# Patient Record
Sex: Female | Born: 1940 | Race: White | Hispanic: No | Marital: Married | Smoking: Former smoker
Health system: Southern US, Community
[De-identification: ages and names within clinical notes are randomized; demographics above are authoritative.]

## PROBLEM LIST (undated history)

## (undated) DIAGNOSIS — E46 Unspecified protein-calorie malnutrition: Secondary | ICD-10-CM

## (undated) DIAGNOSIS — H7290 Unspecified perforation of tympanic membrane, unspecified ear: Secondary | ICD-10-CM

## (undated) DIAGNOSIS — J479 Bronchiectasis, uncomplicated: Secondary | ICD-10-CM

## (undated) DIAGNOSIS — F419 Anxiety disorder, unspecified: Secondary | ICD-10-CM

## (undated) DIAGNOSIS — Z87442 Personal history of urinary calculi: Secondary | ICD-10-CM

## (undated) DIAGNOSIS — R319 Hematuria, unspecified: Secondary | ICD-10-CM

## (undated) DIAGNOSIS — N200 Calculus of kidney: Secondary | ICD-10-CM

## (undated) DIAGNOSIS — J189 Pneumonia, unspecified organism: Secondary | ICD-10-CM

## (undated) DIAGNOSIS — F32A Depression, unspecified: Secondary | ICD-10-CM

## (undated) DIAGNOSIS — M1990 Unspecified osteoarthritis, unspecified site: Secondary | ICD-10-CM

## (undated) HISTORY — DX: Unspecified perforation of tympanic membrane, unspecified ear: H72.90

## (undated) HISTORY — DX: Bronchiectasis, uncomplicated: J47.9

## (undated) HISTORY — PX: BREAST BIOPSY: SHX20

## (undated) HISTORY — PX: OOPHORECTOMY: SHX86

## (undated) HISTORY — DX: Calculus of kidney: N20.0

## (undated) HISTORY — PX: COLONOSCOPY: SHX174

## (undated) HISTORY — DX: Hematuria, unspecified: R31.9

---

## 1998-03-14 ENCOUNTER — Other Ambulatory Visit: Admission: RE | Admit: 1998-03-14 | Discharge: 1998-03-14 | Payer: Self-pay | Admitting: Obstetrics and Gynecology

## 1999-04-17 ENCOUNTER — Other Ambulatory Visit: Admission: RE | Admit: 1999-04-17 | Discharge: 1999-04-17 | Payer: Self-pay | Admitting: Obstetrics and Gynecology

## 2000-04-24 ENCOUNTER — Other Ambulatory Visit: Admission: RE | Admit: 2000-04-24 | Discharge: 2000-04-24 | Payer: Self-pay | Admitting: Obstetrics and Gynecology

## 2001-04-29 ENCOUNTER — Other Ambulatory Visit: Admission: RE | Admit: 2001-04-29 | Discharge: 2001-04-29 | Payer: Self-pay | Admitting: Obstetrics and Gynecology

## 2002-08-10 ENCOUNTER — Other Ambulatory Visit: Admission: RE | Admit: 2002-08-10 | Discharge: 2002-08-10 | Payer: Self-pay | Admitting: Obstetrics and Gynecology

## 2003-11-12 ENCOUNTER — Other Ambulatory Visit: Admission: RE | Admit: 2003-11-12 | Discharge: 2003-11-12 | Payer: Self-pay | Admitting: Obstetrics and Gynecology

## 2003-12-12 ENCOUNTER — Other Ambulatory Visit: Admission: RE | Admit: 2003-12-12 | Discharge: 2003-12-12 | Payer: Self-pay | Admitting: Obstetrics and Gynecology

## 2004-12-18 ENCOUNTER — Other Ambulatory Visit: Admission: RE | Admit: 2004-12-18 | Discharge: 2004-12-18 | Payer: Self-pay | Admitting: Obstetrics and Gynecology

## 2005-09-20 ENCOUNTER — Ambulatory Visit (HOSPITAL_COMMUNITY): Admission: RE | Admit: 2005-09-20 | Discharge: 2005-09-20 | Payer: Self-pay | Admitting: Family Medicine

## 2006-07-10 ENCOUNTER — Other Ambulatory Visit: Admission: RE | Admit: 2006-07-10 | Discharge: 2006-07-10 | Payer: Self-pay | Admitting: Obstetrics and Gynecology

## 2006-08-05 ENCOUNTER — Encounter (INDEPENDENT_AMBULATORY_CARE_PROVIDER_SITE_OTHER): Payer: Self-pay | Admitting: *Deleted

## 2006-08-05 ENCOUNTER — Encounter: Admission: RE | Admit: 2006-08-05 | Discharge: 2006-08-05 | Payer: Self-pay | Admitting: Surgery

## 2006-08-08 HISTORY — PX: BREAST BIOPSY: SHX20

## 2007-07-30 ENCOUNTER — Encounter: Admission: RE | Admit: 2007-07-30 | Discharge: 2007-07-30 | Payer: Self-pay | Admitting: Obstetrics and Gynecology

## 2008-08-01 ENCOUNTER — Encounter: Admission: RE | Admit: 2008-08-01 | Discharge: 2008-08-01 | Payer: Self-pay | Admitting: Obstetrics and Gynecology

## 2008-08-08 ENCOUNTER — Other Ambulatory Visit: Admission: RE | Admit: 2008-08-08 | Discharge: 2008-08-08 | Payer: Self-pay | Admitting: Obstetrics and Gynecology

## 2008-08-08 ENCOUNTER — Ambulatory Visit: Payer: Self-pay | Admitting: Obstetrics and Gynecology

## 2008-08-08 ENCOUNTER — Encounter: Payer: Self-pay | Admitting: Obstetrics and Gynecology

## 2009-02-28 ENCOUNTER — Encounter: Admission: RE | Admit: 2009-02-28 | Discharge: 2009-02-28 | Payer: Self-pay | Admitting: Family Medicine

## 2010-07-16 ENCOUNTER — Ambulatory Visit: Payer: Self-pay | Admitting: Obstetrics and Gynecology

## 2010-07-16 ENCOUNTER — Other Ambulatory Visit: Admission: RE | Admit: 2010-07-16 | Discharge: 2010-07-16 | Payer: Self-pay | Admitting: Obstetrics and Gynecology

## 2010-08-02 ENCOUNTER — Encounter: Admission: RE | Admit: 2010-08-02 | Discharge: 2010-08-02 | Payer: Self-pay | Admitting: Obstetrics and Gynecology

## 2012-05-20 ENCOUNTER — Other Ambulatory Visit: Payer: Self-pay | Admitting: Family Medicine

## 2012-05-20 ENCOUNTER — Other Ambulatory Visit: Payer: Self-pay | Admitting: Obstetrics and Gynecology

## 2012-05-20 DIAGNOSIS — Z1231 Encounter for screening mammogram for malignant neoplasm of breast: Secondary | ICD-10-CM

## 2012-05-25 ENCOUNTER — Other Ambulatory Visit: Payer: Self-pay | Admitting: Family Medicine

## 2012-05-25 DIAGNOSIS — Z780 Asymptomatic menopausal state: Secondary | ICD-10-CM

## 2012-07-01 ENCOUNTER — Ambulatory Visit: Payer: Self-pay

## 2012-07-01 ENCOUNTER — Inpatient Hospital Stay: Admission: RE | Admit: 2012-07-01 | Payer: Self-pay | Source: Ambulatory Visit

## 2012-08-04 ENCOUNTER — Ambulatory Visit
Admission: RE | Admit: 2012-08-04 | Discharge: 2012-08-04 | Disposition: A | Discharge status: Home / Self Care | Payer: BC Managed Care – PPO | Source: Ambulatory Visit | Attending: Family Medicine | Admitting: Family Medicine

## 2012-08-04 DIAGNOSIS — Z780 Asymptomatic menopausal state: Secondary | ICD-10-CM

## 2012-08-04 DIAGNOSIS — Z1231 Encounter for screening mammogram for malignant neoplasm of breast: Secondary | ICD-10-CM

## 2012-10-19 ENCOUNTER — Encounter: Payer: BC Managed Care – PPO | Admitting: Women's Health

## 2013-08-24 ENCOUNTER — Other Ambulatory Visit: Payer: Self-pay

## 2013-08-24 DIAGNOSIS — Z1231 Encounter for screening mammogram for malignant neoplasm of breast: Secondary | ICD-10-CM

## 2013-09-23 ENCOUNTER — Ambulatory Visit
Admission: RE | Admit: 2013-09-23 | Discharge: 2013-09-23 | Disposition: A | Discharge status: Home / Self Care | Payer: Medicare Other | Source: Ambulatory Visit

## 2013-09-23 DIAGNOSIS — Z1231 Encounter for screening mammogram for malignant neoplasm of breast: Secondary | ICD-10-CM

## 2014-06-17 ENCOUNTER — Encounter: Payer: Self-pay | Admitting: Internal Medicine

## 2014-06-17 ENCOUNTER — Institutional Professional Consult (permissible substitution): Payer: Medicare Other | Admitting: Internal Medicine

## 2014-06-17 ENCOUNTER — Ambulatory Visit (INDEPENDENT_AMBULATORY_CARE_PROVIDER_SITE_OTHER): Payer: Medicare Other | Admitting: Internal Medicine

## 2014-06-17 ENCOUNTER — Ambulatory Visit (INDEPENDENT_AMBULATORY_CARE_PROVIDER_SITE_OTHER)
Admission: RE | Admit: 2014-06-17 | Discharge: 2014-06-17 | Disposition: A | Discharge status: Home / Self Care | Payer: Medicare Other | Source: Ambulatory Visit | Attending: Internal Medicine | Admitting: Internal Medicine

## 2014-06-17 ENCOUNTER — Encounter (INDEPENDENT_AMBULATORY_CARE_PROVIDER_SITE_OTHER): Payer: Self-pay

## 2014-06-17 VITALS — BP 118/60 | HR 78 | Temp 98.1°F | Ht 65.0 in | Wt 129.8 lb

## 2014-06-17 DIAGNOSIS — R059 Cough, unspecified: Secondary | ICD-10-CM

## 2014-06-17 DIAGNOSIS — R05 Cough: Secondary | ICD-10-CM | POA: Insufficient documentation

## 2014-06-17 NOTE — Assessment & Plan Note (Addendum)
The most common causes of chronic cough in immunocompetent adults include the following: upper airway cough syndrome (UACS), previously referred to as postnasal drip syndrome (PNDS), which is caused by variety of rhinosinus conditions; (2) asthma; (3) GERD; (4) chronic bronchitis from cigarette smoking or other inhaled environmental irritants; (5) nonasthmatic eosinophilic bronchitis; and (6) bronchiectasis.   These conditions, singly or in combination, have accounted for up to 94% of the causes of chronic cough in prospective studies.   Other conditions have constituted no >6% of the causes in prospective studies These have included bronchogenic carcinoma, chronic interstitial pneumonia, sarcoidosis, left ventricular failure, ACEI-induced cough, and aspiration from a condition associated with pharyngeal dysfunction.    Chronic cough is often simultaneously caused by more than one condition. A single cause has been found from 38 to 82% of the time, multiple causes from 18 to 62%. Multiply caused cough has been the result of three diseases up to 42% of the time.       Based on hx and exam, this is most likely:  Classic Upper airway cough syndrome, so named because it's frequently impossible to sort out how much is  CR/sinusitis with freq throat clearing (which can be related to primary GERD)   vs  causing  secondary (" extra esophageal")  GERD from wide swings in gastric pressure that occur with throat clearing, often  promoting self use of mint and menthol lozenges that reduce the lower esophageal sphincter tone and exacerbate the problem further in a cyclical fashion.   These are the same pts (now being labeled as having "irritable larynx syndrome" by some cough centers) who not infrequently have a history of having failed to tolerate ace inhibitors,  dry powder inhalers or biphosphonates or report having atypical reflux symptoms that don't respond to standard doses of PPI , and are easily confused as  having aecopd or asthma flares by even experienced allergists/ pulmonologists.   The first step is to maximize acid suppression and eliminate sinusitis from ddx then rx with 1st gen H1 per guidelines and regroup in 4 weeks if not better.   See instructions for specific recommendations which were reviewed directly with the patient who was given a copy with highlighter outlining the key components.    Discussed with pt The standardized cough guidelines published in Chest by [employer] [place] in 2006 are still the best available and consist of a multiple step process (up to 12!) , not a single office visit,  and are intended  to address this problem logically,  with an alogrithm dependent on response to empiric treatment at  each progressive step  to determine a specific diagnosis with  minimal addtional testing needed. Therefore if adherence is an issue or can't be accurately verified,  it's very unlikely the standard evaluation and treatment will be successful here.    Furthermore, response to therapy (other than acute cough suppression, which should only be used short term with avoidance of narcotic containing cough syrups if possible), can be a gradual process for which the patient may perceive immediate benefit.  Unlike going to an eye doctor where the best perscription is almost always the first one and is immediately effective, this is almost never the case in the management of chronic cough syndromes. Therefore the patient needs to commit up front to consistently adhere to recommendations  for up to 6 weeks of therapy directed at the likely underlying problem(s) before the response can be reasonably evaluated.

## 2014-06-17 NOTE — Patient Instructions (Addendum)
Please remember to go to the x-ray department downstairs for your tests - we will call you with the results when they are available.  Please see patient coordinator before you leave today  to schedule sinus CT today if at all possible  Best cough medication is mucinex or mucinex dm   GERD (REFLUX)  is an extremely common cause of respiratory symptoms, many times with no significant heartburn at all.    It can be treated with medication, but also with lifestyle changes including avoidance of late meals, excessive alcohol, smoking cessation, and avoid fatty foods, chocolate, peppermint, colas, red wine, and acidic juices such as orange juice.  NO MINT OR MENTHOL PRODUCTS SO NO COUGH DROPS  USE SUGARLESS CANDY INSTEAD (jolley ranchers or Museum/gallery curator) NO OIL BASED VITAMINS - use powdered substitutes.   Pepcid ac 20 mg at bedtime until coughing is gone for at least at week (over the counter)  Have a good trip and call us when return if not all better

## 2014-06-17 NOTE — Progress Notes (Signed)
Subjective:    Patient ID: Chelsea Morales, female    DOB: 1941-06-26  MRN: [id number]  HPI  61 yowf never regular smoker no problem whatsoever until dx with pna 08/2013 in [facility] with chronic exposure to wood fire until spring 2015 but pattern of nocturnal cough since the pna initially a lot of mucus but got better over the summer of 2015 with less discoloration, less volume but still a persistent nightly  throat tickle/cough so referred courtesy of Dr  Merri Brunette to pulmonary clinic 06/17/2014   06/17/2014 1st [facility] office visit/ Doretta Remmert  Chief Complaint  Patient presents with  . Pulmonary Consult    Referred by Dr. Merri Brunette. 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  She states that this onyl occurs when she lies down.    Not limited by breathing from desired activities   Travels frequently and plans trip to [place] w/in a week of ov.  Not aware of a sinus or dental problem and never had obvious anterior nasal drainage. Has had 2 abx ? benefit.  No obvious other patterns in day to day or daytime variabilty or assoc  cp or chest tightness, subjective wheeze overt sinus or hb symptoms. No unusual exp hx or h/o childhood pna/ asthma or knowledge of premature birth.  Sleeping ok without nocturnal  or early am exacerbation  of respiratory  c/o's or need for noct saba. Also denies any obvious fluctuation of symptoms with weather or environmental changes or other aggravating or alleviating factors except as outlined above   Current Medications, Allergies, Complete Past Medical History, Past Surgical History, Family History, and Social History were reviewed in [employer] record.            Review of Systems  Constitutional: Negative for fever, chills and unexpected weight change.  HENT: Negative for congestion, dental problem, ear pain, nosebleeds, postnasal drip, rhinorrhea, sinus pressure, sneezing, sore  throat, trouble swallowing and voice change.   Eyes: Negative for visual disturbance.  Respiratory: Positive for cough. Negative for choking and shortness of breath.   Cardiovascular: Negative for chest pain and leg swelling.  Gastrointestinal: Negative for vomiting, abdominal pain and diarrhea.  Genitourinary: Negative for difficulty urinating.  Musculoskeletal: Negative for arthralgias.  Skin: Negative for rash.  Neurological: Negative for tremors, syncope and headaches.  Hematological: Does not bruise/bleed easily.       Objective:   Physical Exam Healthy appearing amb wf  Wt Readings from Last 3 Encounters:  06/17/14 129 lb 12.8 oz (58.877 kg)     HEENT: nl dentition, turbinates, and orophanx. Nl external ear canals without cough reflex   NECK :  without JVD/Nodes/TM/ nl carotid upstrokes bilaterally   LUNGS: no acc muscle use, clear to A and P bilaterally without cough on insp or exp maneuvers   CV:  RRR  no s3 or murmur or increase in P2, no edema   ABD:  soft and nontender with nl excursion in the supine position. No bruits or organomegaly, bowel sounds nl  MS:  warm without deformities, calf tenderness, cyanosis or clubbing  SKIN: warm and dry without lesions    NEURO:  alert, approp, no deficits    CXR  06/17/2014 :  1. Peribronchial opacity in the left upper lobe lingula. This may be a chronic finding. Acute bronchitis is possible. 2. No other evidence of an acute abnormality. 3. COPD.  Assessment & Plan:

## 2014-06-20 NOTE — Progress Notes (Signed)
Quick Note:  LMTCB ______ 

## 2014-06-22 ENCOUNTER — Telehealth: Payer: Self-pay | Admitting: Internal Medicine

## 2014-06-23 ENCOUNTER — Encounter: Payer: Self-pay | Admitting: Internal Medicine

## 2014-06-23 NOTE — Progress Notes (Signed)
Quick Note:  Called spoke with patient, advised of cxr results / recs as stated by MW. Pt verbalized her understanding and denied any questions. ______ 

## 2014-06-23 NOTE — Telephone Encounter (Signed)
9.17.15 mychart message from pt: Message     I looked at the chest x-ray results. Is there medication I should take with me to [place] or things I can do to improve the state of my lungs?   Called spoke with patient, she is doing well but is leaving for [place] on 9/21 and would like to know if there are any recommendations from MW to help her overall pulmonary health prior to or during her trip.  Pt is aware MW is out of the office this week but will still send message to check for recommendations.  Dr Sherene Sires please advise, thank you.

## 2014-06-23 NOTE — Telephone Encounter (Signed)
Spoke with patient and advised of cxr results per MW Pt also stated that she had viewed her results on mychart Nothing further needed; will sign off

## 2014-06-24 ENCOUNTER — Ambulatory Visit (INDEPENDENT_AMBULATORY_CARE_PROVIDER_SITE_OTHER)
Admission: RE | Admit: 2014-06-24 | Discharge: 2014-06-24 | Disposition: A | Discharge status: Home / Self Care | Payer: Medicare Other | Source: Ambulatory Visit | Attending: Internal Medicine | Admitting: Internal Medicine

## 2014-06-24 ENCOUNTER — Encounter: Payer: Self-pay | Admitting: Internal Medicine

## 2014-06-24 ENCOUNTER — Ambulatory Visit (INDEPENDENT_AMBULATORY_CARE_PROVIDER_SITE_OTHER): Payer: Medicare Other

## 2014-06-24 DIAGNOSIS — Z23 Encounter for immunization: Secondary | ICD-10-CM

## 2014-06-24 DIAGNOSIS — R05 Cough: Secondary | ICD-10-CM

## 2014-06-24 DIAGNOSIS — R059 Cough, unspecified: Secondary | ICD-10-CM

## 2014-06-27 NOTE — Progress Notes (Signed)
Quick Note:  LMTCB ______ 

## 2014-07-07 NOTE — Progress Notes (Signed)
Quick Note:  LMTCB ______ 

## 2014-07-11 NOTE — Progress Notes (Signed)
Quick Note:  LMTCB ______ 

## 2014-07-13 ENCOUNTER — Encounter: Payer: Self-pay | Admitting: *Deleted

## 2014-07-19 ENCOUNTER — Encounter: Payer: Self-pay | Admitting: *Deleted

## 2014-07-19 NOTE — Progress Notes (Signed)
Quick Note:    Letter mailed.  ______

## 2014-08-05 ENCOUNTER — Telehealth: Payer: Self-pay | Admitting: Internal Medicine

## 2014-08-05 NOTE — Telephone Encounter (Signed)
Spoke with the pt  She just returned home from vacation and received my missed calls  I advised that we had called to let her know that her sinus scan was neg  She verbalized understanding  Nothing further needed

## 2014-09-03 ENCOUNTER — Encounter: Payer: Self-pay | Admitting: Internal Medicine

## 2014-10-10 ENCOUNTER — Other Ambulatory Visit: Payer: Self-pay

## 2014-10-10 ENCOUNTER — Encounter: Payer: Self-pay | Admitting: Internal Medicine

## 2014-10-10 DIAGNOSIS — Z1231 Encounter for screening mammogram for malignant neoplasm of breast: Secondary | ICD-10-CM

## 2014-10-19 ENCOUNTER — Ambulatory Visit
Admission: RE | Admit: 2014-10-19 | Discharge: 2014-10-19 | Disposition: A | Discharge status: Home / Self Care | Payer: Medicare Other | Source: Ambulatory Visit

## 2014-10-19 DIAGNOSIS — Z1231 Encounter for screening mammogram for malignant neoplasm of breast: Secondary | ICD-10-CM

## 2014-10-27 ENCOUNTER — Encounter: Payer: Self-pay | Admitting: Internal Medicine

## 2014-11-01 ENCOUNTER — Ambulatory Visit: Payer: Medicare Other | Admitting: Internal Medicine

## 2014-11-07 ENCOUNTER — Ambulatory Visit: Payer: Medicare Other | Admitting: Internal Medicine

## 2014-12-19 ENCOUNTER — Ambulatory Visit: Payer: Medicare Other | Admitting: Internal Medicine

## 2014-12-20 ENCOUNTER — Ambulatory Visit: Payer: Medicare Other | Admitting: Internal Medicine

## 2015-01-27 ENCOUNTER — Other Ambulatory Visit (INDEPENDENT_AMBULATORY_CARE_PROVIDER_SITE_OTHER): Payer: Medicare Other

## 2015-01-27 ENCOUNTER — Encounter: Payer: Self-pay | Admitting: Internal Medicine

## 2015-01-27 ENCOUNTER — Ambulatory Visit (INDEPENDENT_AMBULATORY_CARE_PROVIDER_SITE_OTHER)
Admission: RE | Admit: 2015-01-27 | Discharge: 2015-01-27 | Disposition: A | Discharge status: Home / Self Care | Payer: Medicare Other | Source: Ambulatory Visit | Attending: Internal Medicine | Admitting: Internal Medicine

## 2015-01-27 ENCOUNTER — Ambulatory Visit (INDEPENDENT_AMBULATORY_CARE_PROVIDER_SITE_OTHER): Payer: Medicare Other | Admitting: Internal Medicine

## 2015-01-27 VITALS — BP 112/74 | HR 74 | Ht 65.0 in | Wt 130.8 lb

## 2015-01-27 DIAGNOSIS — R059 Cough, unspecified: Secondary | ICD-10-CM

## 2015-01-27 DIAGNOSIS — R05 Cough: Secondary | ICD-10-CM

## 2015-01-27 LAB — CBC WITH DIFFERENTIAL/PLATELET
BASOS ABS: 0 10*3/uL (ref 0.0–0.1)
Basophils Relative: 0.5 % (ref 0.0–3.0)
EOS ABS: 0.2 10*3/uL (ref 0.0–0.7)
Eosinophils Relative: 3.5 % (ref 0.0–5.0)
HEMATOCRIT: 38.3 % (ref 36.0–46.0)
Hemoglobin: 12.8 g/dL (ref 12.0–15.0)
LYMPHS ABS: 1.9 10*3/uL (ref 0.7–4.0)
Lymphocytes Relative: 28.3 % (ref 12.0–46.0)
MCHC: 33.4 g/dL (ref 30.0–36.0)
MCV: 85.5 fl (ref 78.0–100.0)
MONO ABS: 0.4 10*3/uL (ref 0.1–1.0)
Monocytes Relative: 6.5 % (ref 3.0–12.0)
NEUTROS ABS: 4.2 10*3/uL (ref 1.4–7.7)
NEUTROS PCT: 61.2 % (ref 43.0–77.0)
PLATELETS: 304 10*3/uL (ref 150.0–400.0)
RBC: 4.48 Mil/uL (ref 3.87–5.11)
RDW: 14.4 % (ref 11.5–15.5)
WBC: 6.8 10*3/uL (ref 4.0–10.5)

## 2015-01-27 MED ORDER — PREDNISONE 10 MG PO TABS: ORAL_TABLET | ORAL | Status: DC

## 2015-01-27 MED ORDER — AMOXICILLIN-POT CLAVULANATE 875-125 MG PO TABS: 1.0000 | ORAL_TABLET | Freq: Two times a day (BID) | ORAL | Status: DC

## 2015-01-27 MED ORDER — FAMOTIDINE 20 MG PO TABS: ORAL_TABLET | ORAL | Status: DC

## 2015-01-27 NOTE — Patient Instructions (Addendum)
Augmentin 875 mg take one pill twice daily  X 10 days - take at breakfast and supper with large glass of water.  It would help reduce the usual side effects (diarrhea and yeast infections) if you ate cultured yogurt at lunch.   Prednisone 10 mg take  4 each am x 2 days,   2 each am x 2 days,  1 each am x 2 days and stop  Pepcid 20 mg at bedtime  GERD (REFLUX)  is an extremely common cause of respiratory symptoms just like yours , many times with no obvious heartburn at all.    It can be treated with medication, but also with lifestyle changes including avoidance of late meals, excessive alcohol, smoking cessation, and avoid fatty foods, chocolate, peppermint, colas, red wine, and acidic juices such as orange juice.  NO MINT OR MENTHOL PRODUCTS SO NO COUGH DROPS  USE SUGARLESS CANDY INSTEAD (Jolley ranchers or Stover's or Life Savers) or even ice chips will also do - the key is to swallow to prevent all throat clearing. NO OIL BASED VITAMINS - use powdered substitutes.    Please remember to go to the lab and x-ray department downstairs for your tests - we will call you with the results when they are available.  Please schedule a follow up office visit in 4 weeks, sooner if needed

## 2015-01-27 NOTE — Progress Notes (Signed)
Subjective:    Patient ID: Chelsea Morales, female    DOB: [birth year]  MRN: [id number]    Brief patient profile:  [age] yowf never regular smoker no problem whatsoever until [place] [facility] [place] [place] with chronic exposure to wood fire until spring 2015 but pattern of nocturnal cough since the pna initially a lot of mucus but got better over the summer of 2015 with less discoloration, less volume but still a persistent nightly  throat tickle/cough so referred courtesy of Dr  Merri Brunetteandace Smith to pulmonary clinic 06/17/2014    History of Present Illness  06/17/2014 1st [facility] office visit/ [name]  Chief Complaint  Patient presents with  . Pulmonary Consult    Referred by Dr. Merri Brunetteandace Smith. 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  She states that this onyl occurs when she lies down.    Not limited by breathing from desired activities   Travels frequently and plans trip to [place] w/in a week of ov.  Not aware of a sinus or dental problem and never had obvious anterior nasal drainage. Has had 2 abx ? Benefit. rec  Please see patient coordinator before you leave today  to schedule sinus CT > neg Best cough medication is mucinex or mucinex dm  GERD diet   Pepcid ac 20 mg at bedtime until coughing is gone for at least at week (over the counter)   01/27/2015 f/u ov/[name] re: chronic cough sinc 08/2013  Chief Complaint  Patient presents with  . Follow-up    Pt states her cough is slightly better, but still having noct cough- prod with dark yellow sputum. She states her chest feels sore sometimes after coughing.    most nights about 10 min p supine  Coughs for about 30 m then fine  No obvious day to day or daytime variabilty or assoc doe or cp or chest tightness, subjective wheeze overt sinus or hb symptoms. No unusual exp hx or h/o childhood pna/ asthma or knowledge of premature birth.  Sleeping ok without nocturnal  or early am exacerbation  of  respiratory  c/o's or need for noct saba. Also denies any obvious fluctuation of symptoms with weather or environmental changes or other aggravating or alleviating factors except as outlined above   Current Medications, Allergies, Complete Past Medical History, Past Surgical History, Family History, and Social History were reviewed in [facility] electronic [facility] record.  ROS  The following are not active complaints unless bolded sore throat, dysphagia, dental problems, itching, sneezing,  nasal congestion or excess/ purulent secretions, ear ache,   fever, chills, sweats, unintended wt loss, pleuritic or exertional cp, hemoptysis,  orthopnea pnd or leg swelling, presyncope, palpitations, heartburn, abdominal pain, anorexia, nausea, vomiting, diarrhea  or change in bowel or urinary habits, change in stools or urine, dysuria,hematuria,  rash, arthralgias, visual complaints, headache, numbness weakness or ataxia or problems with walking or coordination,  change in mood/affect or memory.                          Objective:   Physical Exam   Healthy appearing amb wf somber affect   Wt Readings from Last 3 Encounters:  01/27/15 130 lb 12.8 oz (59.33 kg)  06/17/14 129 lb 12.8 oz (58.877 kg)    Vital signs reviewed    HEENT: nl dentition, turbinates, and orophanx. Nl external ear canals without cough reflex  NECK :  without JVD/Nodes/TM/ nl carotid upstrokes bilaterally   LUNGS: no acc muscle use, clear to A and P bilaterally without cough on insp or exp maneuvers   CV:  RRR  no s3 or murmur or increase in P2, no edema   ABD:  soft and nontender with nl excursion in the supine position. No bruits or organomegaly, bowel sounds nl  MS:  warm without deformities, calf tenderness, cyanosis or clubbing  SKIN: warm and dry without lesions    NEURO:  alert, approp, no deficits    CXR PA and Lateral:   01/27/2015 :     I personally reviewed images and agree with radiology  impression as follows:      Slightly increased opacity in lingula adjacent to LEFT heart border, more prominent than on previous exam, subtle developing nodule not excluded.       Assessment & Plan:

## 2015-01-28 ENCOUNTER — Encounter: Payer: Self-pay | Admitting: Internal Medicine

## 2015-01-28 NOTE — Assessment & Plan Note (Addendum)
-   onset 08/2013 p pna - Sinus CT 06/24/2014 >  Trace frontal sinus mucosal thickening, otherwise clear sinuses - allergy profile 01/27/2015 >> - cxr 01/27/15 ? Lingular density  The most common causes of chronic cough in immunocompetent adults include the following: upper airway cough syndrome (UACS), previously referred to as postnasal drip syndrome (PNDS), which is caused by variety of rhinosinus conditions; (2) asthma; (3) GERD; (4) chronic bronchitis from cigarette smoking or other inhaled environmental irritants; (5) nonasthmatic eosinophilic bronchitis; and (6) bronchiectasis.   These conditions, singly or in combination, have accounted for up to 94% of the causes of chronic cough in prospective studies.   Other conditions have constituted no >6% of the causes in prospective studies These have included bronchogenic carcinoma, chronic interstitial pneumonia, sarcoidosis, left ventricular failure, ACEI-induced cough, and aspiration from a condition associated with pharyngeal dysfunction.    Chronic cough is often simultaneously caused by more than one condition. A single cause has been found from 38 to 82% of the time, multiple causes from 18 to 62%. Multiply caused cough has been the result of three diseases up to 42% of the time.       Reviewed with pt The standardized cough guidelines published in Chest by [employer] [place] in 2006 are still the best available and consist of a multiple step process (up to 12!) , not a single office visit,  and are intended  to address this problem logically,  with an alogrithm dependent on response to empiric treatment at  each progressive step  to determine a specific diagnosis with  minimal addtional testing needed. Therefore if adherence is an issue or can't be accurately verified,  it's very unlikely the standard evaluation and treatment will be successful here.    Furthermore, response to therapy (other than acute cough suppression, which should only be used  short term with avoidance of narcotic containing cough syrups if possible), can be a gradual process for which the patient may perceive immediate benefit.  Unlike going to an eye doctor where the best perscription is almost always the first one and is immediately effective, this is almost never the case in the management of chronic cough syndromes. Therefore the patient needs to commit up front to consistently adhere to recommendations  for up to 6 weeks of therapy directed at the likely underlying problem(s) before the response can be reasonably evaluated.    rec  The onset after pna is suggestive of bronchiectasis/ Lingular syndrome supported on cxr > ct next  In meantime rec noct h2 and one course augmentin/ 6 days of prednisone empirically so see what short term benefit is.  See instructions for specific recommendations which were reviewed directly with the patient who was given a copy with highlighter outlining the key components.

## 2015-01-30 ENCOUNTER — Telehealth: Payer: Self-pay | Admitting: Internal Medicine

## 2015-01-30 DIAGNOSIS — R059 Cough, unspecified: Secondary | ICD-10-CM

## 2015-01-30 DIAGNOSIS — R05 Cough: Secondary | ICD-10-CM

## 2015-01-30 DIAGNOSIS — R9389 Abnormal findings on diagnostic imaging of other specified body structures: Secondary | ICD-10-CM

## 2015-01-30 NOTE — Telephone Encounter (Signed)
Result Notes     Notes Recorded by Christen ButterLeslie M Raskin, CMA on 01/30/2015 at 12:11 PM [facility] ------  Notes Recorded by Nyoka CowdenMichael B Wert, MD on 01/28/2015 at 6:03 AM Call pt: Reviewed cxr and There is an area on L slt irregular, no def mass but to be sure rec ct chest with contrast with sinus ct if not already done   Pt is aware of results. Orders will be placed for CTs.

## 2015-01-30 NOTE — Progress Notes (Signed)
Quick Note:  LMTCB ______ 

## 2015-01-31 ENCOUNTER — Other Ambulatory Visit (INDEPENDENT_AMBULATORY_CARE_PROVIDER_SITE_OTHER): Payer: Medicare Other

## 2015-01-31 DIAGNOSIS — R059 Cough, unspecified: Secondary | ICD-10-CM

## 2015-01-31 DIAGNOSIS — R05 Cough: Secondary | ICD-10-CM | POA: Diagnosis not present

## 2015-01-31 DIAGNOSIS — R938 Abnormal findings on diagnostic imaging of other specified body structures: Secondary | ICD-10-CM | POA: Diagnosis not present

## 2015-01-31 DIAGNOSIS — R9389 Abnormal findings on diagnostic imaging of other specified body structures: Secondary | ICD-10-CM

## 2015-01-31 LAB — BASIC METABOLIC PANEL
BUN: 20 mg/dL (ref 6–23)
CALCIUM: 10 mg/dL (ref 8.4–10.5)
CO2: 29 meq/L (ref 19–32)
Chloride: 105 mEq/L (ref 96–112)
Creatinine, Ser: 0.87 mg/dL (ref 0.40–1.20)
GFR: 67.33 mL/min (ref >60.00)
GLUCOSE: 126 mg/dL — AB (ref 70–99)
Potassium: 5.2 mEq/L — ABNORMAL HIGH (ref 3.5–5.1)
SODIUM: 141 meq/L (ref 135–145)

## 2015-02-02 ENCOUNTER — Other Ambulatory Visit: Payer: Medicare Other

## 2015-02-06 ENCOUNTER — Ambulatory Visit (INDEPENDENT_AMBULATORY_CARE_PROVIDER_SITE_OTHER)
Admission: RE | Admit: 2015-02-06 | Discharge: 2015-02-06 | Disposition: A | Discharge status: Home / Self Care | Payer: Medicare Other | Source: Ambulatory Visit | Attending: Internal Medicine | Admitting: Internal Medicine

## 2015-02-06 DIAGNOSIS — R938 Abnormal findings on diagnostic imaging of other specified body structures: Secondary | ICD-10-CM

## 2015-02-06 DIAGNOSIS — R9389 Abnormal findings on diagnostic imaging of other specified body structures: Secondary | ICD-10-CM

## 2015-02-06 MED ORDER — IOHEXOL 300 MG/ML  SOLN
80.0000 mL | Freq: Once | INTRAMUSCULAR | Status: AC | PRN
  Administered 2015-02-06: 80 mL via INTRAVENOUS

## 2015-02-06 NOTE — Progress Notes (Signed)
Quick Note:  Spoke with pt and notified of results per Dr. Wert. Pt verbalized understanding and denied any questions.  ______ 

## 2015-03-07 ENCOUNTER — Encounter: Payer: Self-pay | Admitting: Internal Medicine

## 2015-03-07 ENCOUNTER — Ambulatory Visit (INDEPENDENT_AMBULATORY_CARE_PROVIDER_SITE_OTHER): Payer: Medicare Other | Admitting: Internal Medicine

## 2015-03-07 VITALS — BP 122/58 | HR 80 | Ht 65.0 in | Wt 127.0 lb

## 2015-03-07 DIAGNOSIS — R059 Cough, unspecified: Secondary | ICD-10-CM

## 2015-03-07 DIAGNOSIS — R05 Cough: Secondary | ICD-10-CM

## 2015-03-07 DIAGNOSIS — J479 Bronchiectasis, uncomplicated: Secondary | ICD-10-CM

## 2015-03-07 NOTE — Progress Notes (Signed)
Subjective:    Patient ID: Chelsea Morales, female    DOB: Dec 09, 1940  MRN: [id number]    Brief patient profile:  89 yowf never regular smoker no problem whatsoever until [place] [facility] [place] with chronic exposure to wood fire until spring 2015 but pattern of nocturnal cough since the pna initially a lot of mucus but got better over the summer of 2015 with less discoloration, less volume but still a persistent nightly  throat tickle/cough so referred courtesy of Dr  Merri Brunetteandace Smith to pulmonary clinic 06/17/2014 with confirmed bronchiectasis on CT chest 02/06/15.   History of Present Illness  06/17/2014 1st [facility] office visit/ Chelsea Morales  Chief Complaint  Patient presents with  . Pulmonary Consult    Referred by Dr. Merri Brunetteandace Smith. 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  She states that this onyl occurs when she lies down.    Not limited by breathing from desired activities   Travels frequently and [place] trip to [place]/in a week of ov.  Not aware of a sinus or dental problem and never had obvious anterior nasal drainage. Has had 2 abx ? Benefit. rec  Please see patient coordinator before you leave today  to schedule sinus CT > neg Best cough medication is mucinex or mucinex dm  GERD diet   Pepcid ac 20 mg at bedtime until coughing is gone for at least at week (over the counter)   01/27/2015 f/u ov/Chelsea Morales re: chronic cough sinc 08/2013  Chief Complaint  Patient presents with  . Follow-up    Pt states her cough is slightly better, but still having noct cough- prod with dark yellow sputum. She states her chest feels sore sometimes after coughing.   most nights about 10 min p supine  Coughs for about 30 m then fine rec Augmentin 875 mg take one pill twice daily  X 10 days - take at breakfast and supper with large glass of water.  It would help reduce the usual side effects (diarrhea and yeast infections) if you ate cultured yogurt at  lunch.  Prednisone 10 mg take  4 each am x 2 days,   2 each am x 2 days,  1 each am x 2 days and stop Pepcid 20 mg at bedtime GERD  Diet   03/07/2015 f/u ov/Chelsea Morales re: Bronchiectasis  Chief Complaint  Patient presents with  . Follow-up    Cough has resolved. No new co's today.   on no meds at all / Not limited by breathing from desired activities    No obvious day to day or daytime variabilty or assoc cough or  cp or chest tightness, subjective wheeze overt sinus or hb symptoms. No unusual exp hx or h/o childhood pna/ asthma or knowledge of premature birth.  Sleeping ok without nocturnal  or early am exacerbation  of respiratory  c/o's or need for noct saba. Also denies any obvious fluctuation of symptoms with weather or environmental changes or other aggravating or alleviating factors except as outlined above   Current Medications, Allergies, Complete Past Medical History, Past Surgical History, Family History, and Social History were reviewed in [facility] electronic medical record.  ROS  The following are not active complaints unless bolded sore throat, dysphagia, dental problems, itching, sneezing,  nasal congestion or excess/ purulent secretions, ear ache,   fever, chills, sweats, unintended wt loss, pleuritic or exertional cp, hemoptysis,  orthopnea pnd or leg swelling,  presyncope, palpitations, heartburn, abdominal pain, anorexia, nausea, vomiting, diarrhea  or change in bowel or urinary habits, change in stools or urine, dysuria,hematuria,  rash, arthralgias, visual complaints, headache, numbness weakness or ataxia or problems with walking or coordination,  change in mood/affect or memory.                          Objective:   Physical Exam   Healthy appearing amb wf somber affect   Wt Readings from Last 3 Encounters:  03/07/15 127 lb (57.607 kg)  01/27/15 130 lb 12.8 oz (59.33 kg)  06/17/14 129 lb 12.8 oz (58.877 kg)    Vital signs reviewed        HEENT: nl  dentition, turbinates, and orophanx. Nl external ear canals without cough reflex   NECK :  without JVD/Nodes/TM/ nl carotid upstrokes bilaterally   LUNGS: no acc muscle use, clear to A and P bilaterally without cough on insp or exp maneuvers   CV:  RRR  no s3 or murmur or increase in P2, no edema   ABD:  soft and nontender with nl excursion in the supine position. No bruits or organomegaly, bowel sounds nl  MS:  warm without deformities, calf tenderness, cyanosis or clubbing  SKIN: warm and dry without lesions    NEURO:  alert, approp, no deficits     I personally reviewed images and agree with radiology impression as follows:  CT Chest 02/06/15 Bronchiectasis and scarring within the right middle lobe and lingula with scattered areas of tree-in-bud nodular opacities, bronchiectasis and mucoid impaction throughout the lungs described above. 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  Prominent precarinal lymph node likely reactive in etiology.             Assessment & Plan:

## 2015-03-07 NOTE — Assessment & Plan Note (Signed)
See cough a/p 

## 2015-03-07 NOTE — Assessment & Plan Note (Addendum)
-   onset 08/2013 p pna - Sinus CT 06/24/2014 >  Trace frontal sinus mucosal thickening, otherwise clear sinuses - allergy profile 01/27/2015  > Eos 0.2 / RAST not done  - repeat sinus CT 02/06/2015 wnl  - cxr 01/27/15 ? Lingular density > ct chest  02/06/2015 : Bronchiectasis and scarring within the right middle lobe and lingula with scattered areas of tree-in-bud nodular opacities, bronchiectasis and mucoid impaction throughout the lungs described above  Symptoms resolved p rx with augmentin / prednisone at least for now but likely to recur.   I had an extended discussion with the patient reviewing all relevant studies completed to date and  lasting 15 to 20 minutes of a 25 minute visit on the following ongoing concerns:    1) pathophys of Bronchiectasis/ MAI reviewed  2) was former smoker so needs pfts baseline > return in 3 m  3) no maint rx needed for now   See instructions for specific recommendations which were reviewed directly with the patient who was given a copy with highlighter outlining the key components.

## 2015-03-07 NOTE — Patient Instructions (Signed)
Bronchiectasis =   you have scarring of your bronchial tubes which means that they don't function perfectly normally and mucus tends to pool in certain areas of your lung which can cause pneumonia and further scarring of your lung and bronchial tubes and also result in a low grade infection called MAI   Whenever you develop cough congestion take mucinex or mucinex dm > these will help keep the mucus loose and flowing but if your condition worsens you need to seek help immediately preferably here or somewhere inside the [facility] system to compare xrays ( worse = darker or bloody mucus or pain on breathing in)   Please schedule a follow up visit in 3 months but call sooner if needed  With pfts on return to determine the overall effect on your airflow

## 2015-06-28 ENCOUNTER — Encounter: Payer: Self-pay | Admitting: Internal Medicine

## 2015-07-28 ENCOUNTER — Encounter: Payer: Self-pay | Admitting: Internal Medicine

## 2015-07-28 ENCOUNTER — Ambulatory Visit (INDEPENDENT_AMBULATORY_CARE_PROVIDER_SITE_OTHER): Payer: Medicare Other | Admitting: Internal Medicine

## 2015-07-28 VITALS — BP 120/68 | HR 76 | Ht 65.0 in | Wt 131.0 lb

## 2015-07-28 DIAGNOSIS — J479 Bronchiectasis, uncomplicated: Secondary | ICD-10-CM

## 2015-07-28 DIAGNOSIS — Z23 Encounter for immunization: Secondary | ICD-10-CM | POA: Diagnosis not present

## 2015-07-28 DIAGNOSIS — R059 Cough, unspecified: Secondary | ICD-10-CM

## 2015-07-28 DIAGNOSIS — R05 Cough: Secondary | ICD-10-CM | POA: Diagnosis not present

## 2015-07-28 LAB — PULMONARY FUNCTION TEST
FEF 25-75 PRE: 1.56 L/s
FEF 25-75 Post: 1.5 L/sec
FEF2575-%Change-Post: -3 %
FEF2575-%PRED-PRE: 93 %
FEF2575-%Pred-Post: 90 %
FEV1-%Change-Post: 0 %
FEV1-%Pred-Post: 99 %
FEV1-%Pred-Pre: 99 %
FEV1-PRE: 2.14 L
FEV1-Post: 2.14 L
FEV1FVC-%CHANGE-POST: 6 %
FEV1FVC-%Pred-Pre: 97 %
FEV6-%CHANGE-POST: -5 %
FEV6-%PRED-POST: 101 %
FEV6-%PRED-PRE: 107 %
FEV6-Post: 2.77 L
FEV6-Pre: 2.93 L
FEV6FVC-%CHANGE-POST: 0 %
FEV6FVC-%Pred-Post: 105 %
FEV6FVC-%Pred-Pre: 104 %
FVC-%Change-Post: -5 %
FVC-%PRED-POST: 95 %
FVC-%Pred-Pre: 101 %
FVC-Post: 2.77 L
FVC-Pre: 2.94 L
Post FEV1/FVC ratio: 77 %
Post FEV6/FVC ratio: 100 %
Pre FEV1/FVC ratio: 73 %
Pre FEV6/FVC Ratio: 100 %
RV % pred: 101 %
RV: 2.39 L
TLC % PRED: 102 %
TLC: 5.33 L

## 2015-07-28 MED ORDER — FLUTTER DEVI: Status: AC

## 2015-07-28 MED ORDER — AMOXICILLIN-POT CLAVULANATE 875-125 MG PO TABS: 1.0000 | ORAL_TABLET | Freq: Two times a day (BID) | ORAL | Status: DC

## 2015-07-28 NOTE — Progress Notes (Signed)
Subjective:    Patient ID: Chelsea Morales, female    DOB: 04/13/1941  MRN: [id number]    Brief patient profile:  14 yowf never regular smoker no problem whatsoever until dx with pna 08/2013 in [facility] with chronic exposure to wood fire until spring 2015 but pattern of nocturnal cough since the pna initially a lot of mucus but got better over the summer of 2015 with less discoloration, less volume but still a persistent nightly  throat tickle/cough so referred courtesy of Dr  Merri Brunette to pulmonary clinic 06/17/2014 with confirmed bronchiectasis on CT chest 02/06/15 and nl pft's 07/28/2015    History of Present Illness  06/17/2014 1st [facility] office visit/ Chelsea Morales  Chief Complaint  Patient presents with  . Pulmonary Consult    Referred by Dr. Merri Brunette. 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  She states that this onyl occurs when she lies down.    Not limited by breathing from desired activities   Travels frequently and plans trip to [place] w/in a week of ov.  Not aware of a sinus or dental problem and never had obvious anterior nasal drainage. Has had 2 abx ? Benefit. rec  Please see patient coordinator before you leave today  to schedule sinus CT > neg Best cough medication is mucinex or mucinex dm  GERD diet   Pepcid ac 20 mg at bedtime until coughing is gone for at least at week (over the counter)   01/27/2015 f/u ov/Chelsea Morales re: chronic cough sinc 08/2013  Chief Complaint  Patient presents with  . Follow-up    Pt states her cough is slightly better, but still having noct cough- prod with dark yellow sputum. She states her chest feels sore sometimes after coughing.   most nights about 10 min p supine  Coughs for about 30 m then fine rec Augmentin 875 mg take one pill twice daily  X 10 days - take at breakfast and supper with large glass of water.  It would help reduce the usual side effects (diarrhea and yeast infections) if you  ate cultured yogurt at lunch.  Prednisone 10 mg take  4 each am x 2 days,   2 each am x 2 days,  1 each am x 2 days and stop Pepcid 20 mg at bedtime GERD  Diet   03/07/2015 f/u ov/Chelsea Morales re: Bronchiectasis  Chief Complaint  Patient presents with  . Follow-up    Cough has resolved. No new co's today.   on no meds at all / Not limited by breathing from desired activities  rec Bronchiectasis =   you have scarring of your bronchial tubes  Whenever you develop cough congestion take mucinex or mucinex dm    07/28/2015  f/u ov/Chelsea Morales re: bronchiectasis with nl pfts Chief Complaint  Patient presents with  . Follow-up    Pt c/o increased cough for the past 3 months- prod with yellow sputum. She also c/o nasal congestion.   chronic hs cough x 15 min then sleeps ok thru  the night  Acute Nasal congestion x one week R cp x  Summer of 2015 high R ant/ worse when lie down and cough but can avoid by lying flat worse in either decub position    No obvious day to day or daytime variabilty or limiting sob/  chest tightness, subjective wheeze or overt   hb symptoms. No unusual exp hx or h/o childhood pna/ asthma  or knowledge of premature birth.  Sleeping ok without nocturnal  or early am exacerbation  of respiratory  c/o's or need for noct saba. Also denies any obvious fluctuation of symptoms with weather or environmental changes or other aggravating or alleviating factors except as outlined above   Current Medications, Allergies, Complete Past Medical History, Past Surgical History, Family History, and Social History were reviewed in [facility] electronic medical record.  ROS  The following are not active complaints unless bolded sore throat, dysphagia, dental problems, itching, sneezing,  nasal congestion or excess/ purulent secretions, ear ache,   fever, chills, sweats, unintended wt loss, pleuritic or exertional cp, hemoptysis,  orthopnea pnd or leg swelling, presyncope, palpitations, heartburn,  abdominal pain, anorexia, nausea, vomiting, diarrhea  or change in bowel or urinary habits, change in stools or urine, dysuria,hematuria,  rash, arthralgias, visual complaints, headache, numbness weakness or ataxia or problems with walking or coordination,  change in mood/affect or memory.                          Objective:   Physical Exam   Healthy appearing amb wf somber affect   07/28/2015      131  Wt Readings from Last 3 Encounters:  03/07/15 127 lb (57.607 kg)  01/27/15 130 lb 12.8 oz (59.33 kg)  06/17/14 129 lb 12.8 oz (58.877 kg)    Vital signs reviewed        HEENT: nl dentition, turbinates, and orophanx. Nl external ear canals without cough reflex   NECK :  without JVD/Nodes/TM/ nl carotid upstrokes bilaterally   LUNGS: no acc muscle use, clear to A and P bilaterally without cough on insp or exp maneuvers   CV:  RRR  no s3 or murmur or increase in P2, no edema   ABD:  soft and nontender with nl excursion in the supine position. No bruits or organomegaly, bowel sounds nl  MS:  warm without deformities, calf tenderness, cyanosis or clubbing  SKIN: warm and dry without lesions    NEURO:  alert, approp, no deficits     I personally reviewed images and agree with radiology impression as follows:  CT Chest 02/06/15 Bronchiectasis and scarring within the right middle lobe and lingula with scattered areas of tree-in-bud nodular opacities, bronchiectasis and mucoid impaction throughout the lungs described above. 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              Assessment & Plan:

## 2015-07-28 NOTE — Patient Instructions (Addendum)
Flu shot today   Augmentin 875 mg take one pill twice daily  X 10 days - take at breakfast and supper with large glass of water.  It would help reduce the usual side effects (diarrhea and yeast infections) if you ate cultured yogurt at lunch.   Bronchiectasis =   you have scarring of your bronchial tubes which means that they don't function perfectly normally and mucus tends to pool in certain areas of your lung which can cause pneumonia and further scarring of your lung and bronchial tubes  Whenever you develop cough congestion take mucinex or mucinex dm and flutter valve  > these will help keep the mucus loose and flowing but if your condition worsens you need to seek help immediately preferably here or somewhere inside the [facility] system to compare xrays ( worse = darker or bloody mucus or pain on breathing in)    Please schedule a follow up visit in 3 months but call sooner if needed with cxr on return - call sooner if cough not better on above plan

## 2015-07-28 NOTE — Progress Notes (Signed)
PFT performed today. DLCO could not be performed due to technical difficulties with the PFT machine. MW was made aware of this prior to testing. 

## 2015-07-29 ENCOUNTER — Encounter: Payer: Self-pay | Admitting: Internal Medicine

## 2015-07-29 NOTE — Assessment & Plan Note (Addendum)
-   Prevnar 13  06/24/14  - See CT chest 02/06/15 - PFT's  07/28/2015  FEV1 2.14 (99 % ) ratio 77  p no % improvement from saba    Reviewed with her the concept of bronchiectasis as they relate to her symptoms with possible complication of atypical tuberculosis or resistant organisms but unlikely to be the case present so hold further w/u until see if we can improve symptoms with conservative rx with short course augmentin (since has acute nasal symptoms ) and prn mucinex dm/ flutter valve    I had an extended discussion with the patient reviewing all relevant studies completed to date and  lasting 15 to 20 minutes of a 25 minute visit    Each maintenance medication was reviewed in detail including most importantly the difference between maintenance and prns and under what circumstances the prns are to be triggered using an action plan format that is not reflected in the computer generated alphabetically organized AVS.    Please see instructions for details which were reviewed in writing and the patient given a copy highlighting the part that I personally wrote and discussed at today's ov.

## 2015-09-27 ENCOUNTER — Telehealth: Payer: Self-pay | Admitting: Internal Medicine

## 2015-09-27 NOTE — Telephone Encounter (Signed)
lmtcb x1 for pt. 

## 2015-09-28 NOTE — Telephone Encounter (Signed)
atc pt, no answer, no vm.  Wcb.  

## 2015-09-29 NOTE — Telephone Encounter (Signed)
Left message for patient to call back  

## 2015-10-03 NOTE — Telephone Encounter (Signed)
Fine with me

## 2015-10-03 NOTE — Telephone Encounter (Signed)
Please advise MR thanks 

## 2015-10-03 NOTE — Telephone Encounter (Signed)
Spoke with pt. She is aware of our office policy for switching providers.  MW - please advise if you are okay with pt switching to MR.  Thanks.

## 2015-10-11 NOTE — Telephone Encounter (Signed)
Ok by me; first available

## 2015-10-11 NOTE — Telephone Encounter (Signed)
Spoke with pt, scheduled appt with MR next available.  Nothing further needed.

## 2015-10-30 ENCOUNTER — Ambulatory Visit: Payer: Medicare Other | Admitting: Internal Medicine

## 2015-11-24 ENCOUNTER — Other Ambulatory Visit: Payer: Self-pay

## 2015-11-24 DIAGNOSIS — Z1231 Encounter for screening mammogram for malignant neoplasm of breast: Secondary | ICD-10-CM

## 2015-12-13 ENCOUNTER — Ambulatory Visit
Admission: RE | Admit: 2015-12-13 | Discharge: 2015-12-13 | Disposition: A | Discharge status: Home / Self Care | Payer: Medicare Other | Source: Ambulatory Visit

## 2015-12-13 DIAGNOSIS — Z1231 Encounter for screening mammogram for malignant neoplasm of breast: Secondary | ICD-10-CM

## 2015-12-14 ENCOUNTER — Ambulatory Visit (INDEPENDENT_AMBULATORY_CARE_PROVIDER_SITE_OTHER): Payer: Medicare Other | Admitting: Internal Medicine

## 2015-12-14 ENCOUNTER — Encounter: Payer: Self-pay | Admitting: Internal Medicine

## 2015-12-14 ENCOUNTER — Other Ambulatory Visit (INDEPENDENT_AMBULATORY_CARE_PROVIDER_SITE_OTHER): Payer: Medicare Other

## 2015-12-14 VITALS — BP 114/62 | HR 66 | Ht 65.0 in | Wt 126.0 lb

## 2015-12-14 DIAGNOSIS — J479 Bronchiectasis, uncomplicated: Secondary | ICD-10-CM | POA: Diagnosis not present

## 2015-12-14 LAB — CBC WITH DIFFERENTIAL/PLATELET
BASOS PCT: 1 % (ref 0.0–3.0)
Basophils Absolute: 0.1 10*3/uL (ref 0.0–0.1)
EOS PCT: 3.4 % (ref 0.0–5.0)
Eosinophils Absolute: 0.2 10*3/uL (ref 0.0–0.7)
HEMATOCRIT: 40.5 % (ref 36.0–46.0)
HEMOGLOBIN: 13.5 g/dL (ref 12.0–15.0)
Lymphocytes Relative: 26.7 % (ref 12.0–46.0)
Lymphs Abs: 1.7 10*3/uL (ref 0.7–4.0)
MCHC: 33.3 g/dL (ref 30.0–36.0)
MCV: 85.3 fl (ref 78.0–100.0)
MONO ABS: 0.4 10*3/uL (ref 0.1–1.0)
Monocytes Relative: 5.4 % (ref 3.0–12.0)
Neutro Abs: 4.1 10*3/uL (ref 1.4–7.7)
Neutrophils Relative %: 63.5 % (ref 43.0–77.0)
Platelets: 280 10*3/uL (ref 150.0–400.0)
RBC: 4.74 Mil/uL (ref 3.87–5.11)
RDW: 14 % (ref 11.5–15.5)
WBC: 6.5 10*3/uL (ref 4.0–10.5)

## 2015-12-14 LAB — RHEUMATOID FACTOR: RHEUMATOID FACTOR: 12 [IU]/mL (ref <=14)

## 2015-12-14 NOTE — Progress Notes (Signed)
Subjective:    Patient ID: Chelsea SmolderBonnie G Napoli, female    DOB: 11/02/1940, 75 y.o.   MRN: [id number] \ PCP Allean FoundSMITH,CANDACE THIELE, MD   HPI   OV 12/14/2015  Chief Complaint  Patient presents with  . Pulmonary Consult    Pt changing providers from MW to MR. Pt states her breathing has not changed since last OV in 07/2015 with MW. Pt c/o prod cough with yellow mucus. Pt denies SOB and CP/tightness.     75 year old female. Transfer of care from Dr. Sherene Sireswert to myself Dr. Marchelle Gearingamaswamy for bronchiectasis.  W[place]ened from [place] but has lived in [place] for 40 years. Her [place] to [place] and in 2014 she visited her and suffered pneumonia. Prior to that she had no respiratory problems. Following recovery from the pneumonia she's had chronic cough which still persists without change. The cough is better when she is sitting erect but when she lies down she has several episodes and brings out at least 3-5 tissue full of yellow sputum. In the last 3 years she's had 3 course of antibiotics/prednisone. This as best that she can recollect. There are no new admissions. She has lost some weight recently but this is intentional following walking. She does not get dyspneic or wheeze at any time including during walking. Overall symptom burden is mild but she finds the cough and sputum disgusting and she wants to understand why.  May 2016 CT scan of the chest personally visualized shows right middle lobe and lingular bronchiectasis with some bronchiectasis in the left lower lobe.  Pulmonary function test to determine to be normal per chart review.    has a past medical history of Bronchiectasis (HCC); Ruptured ear drum; Hematuria; and Kidney stones.   reports that she quit smoking about 56 years ago. Her smoking use included Cigarettes. She has a .25 pack-year smoking history. She has never used smokeless tobacco.  Past Surgical History  Procedure Laterality Date  . Oophorectomy       Allergies  Allergen Reactions  . Sulfa Antibiotics Anaphylaxis    Immunization History  Administered Date(s) Administered  . Influenza,inj,Quad PF,36+ Mos 06/24/2014, 07/28/2015  . Pneumococcal Conjugate-13 06/24/2014    Family History  Problem Relation Age of Onset  . Heart failure Brother   . Breast cancer Mother   . Colon cancer Mother   . Heart attack Father      Current outpatient prescriptions:  .  Respiratory Therapy Supplies (FLUTTER) DEVI, Use as directed, Disp: 1 each, Rfl: 0     Review of Systems  Constitutional: Negative for fever and unexpected weight change.  HENT: Positive for congestion. Negative for dental problem, ear pain, nosebleeds, postnasal drip, rhinorrhea, sinus pressure, sneezing, sore throat and trouble swallowing.   Eyes: Negative for redness and itching.  Respiratory: Positive for cough. Negative for chest tightness, shortness of breath and wheezing.   Cardiovascular: Negative for palpitations and leg swelling.  Gastrointestinal: Negative for nausea and vomiting.  Genitourinary: Negative for dysuria.  Musculoskeletal: Negative for joint swelling.  Skin: Negative for rash.  Neurological: Negative for headaches.  Hematological: Does not bruise/bleed easily.  Psychiatric/Behavioral: Negative for dysphoric mood. The patient is not nervous/anxious.        Objective:   Physical Exam  Constitutional: She is oriented to person, place, and time. She appears well-developed and well-nourished. No distress.  HENT:  Head: Normocephalic and atraumatic.  Right Ear: External ear normal.  Left Ear: External ear normal.  Mouth/Throat: Oropharynx  is clear and moist. No oropharyngeal exudate.  Eyes: Conjunctivae and EOM are normal. Pupils are equal, round, and reactive to light. Right eye exhibits no discharge. Left eye exhibits no discharge. No scleral icterus.  Neck: Normal range of motion. Neck supple. No JVD present. No tracheal deviation present.  No thyromegaly present.  Cardiovascular: Normal rate, regular rhythm, normal heart sounds and intact distal pulses.  Exam reveals no gallop and no friction rub.   No murmur heard. Pulmonary/Chest: Effort normal and breath sounds normal. No respiratory distress. She has no wheezes. She has no rales. She exhibits no tenderness.  Abdominal: Soft. Bowel sounds are normal. She exhibits no distension and no mass. There is no tenderness. There is no rebound and no guarding.  Musculoskeletal: Normal range of motion. She exhibits no edema or tenderness.  Lymphadenopathy:    She has no cervical adenopathy.  Neurological: She is alert and oriented to person, place, and time. She has normal reflexes. No cranial nerve deficit. She exhibits normal muscle tone. Coordination normal.  Skin: Skin is warm and dry. No rash noted. She is not diaphoretic. No erythema. No pallor.  Psychiatric: She has a normal mood and affect. Her behavior is normal. Judgment and thought content normal.  Vitals reviewed.   Filed Vitals:   12/14/15 0908  BP: 114/62  Pulse: 66  Height:  (1.651 m)  Weight: 126 lb (57.153 kg)  SpO2: 99%         Assessment & Plan:     ICD-9-CM ICD-10-CM   1. Bronchiectasis without complication (HCC) 494.0 J47.9     bialteral bronchiectasis with chronic coughn. SHe desires to seek etioloigy and Rx to get rid of cough though after discussion might be reluctant for MAI Rx if thait is what this is. Nevertheless after discussing ddx, limitations of dx tests and benefits/risks she is willing to go through with blood work +/- Therapist, sports as a minimum    - Do complete blood count with differential, rheumatoid factor, cyclic citrulline peptide,, ANA antibody, alpha-1 antitrypsin, total IgE, total immunoglobulins of IgG, IgM and IgA, and Aspergillus IgG, Quantiferon gold  - Depending on these results we'll plan for bronchoscopy with lavage for microbiology sample  Follow-up - We will call you  with the blood test results.   > 50% of this > 25 min visit spent in face to face counseling or coordination of care   Dr. Kalman Shan, M.D., [facility]C.P Pulmonary and Critical Care Medicine Staff Physician [facility] [place] [facility] and Critical Care Pager: [phone], If no answer or between  15:00h - 7:00h: call [phone]  12/14/2015 9:55 AM

## 2015-12-14 NOTE — Patient Instructions (Addendum)
ICD-9-CM ICD-10-CM   1. Bronchiectasis without complication (HCC) 494.0 J47.9     - Do complete blood count with differential, rheumatoid factor, cyclic citrulline peptide,, ANA antibody, alpha-1 antitrypsin, total IgE, total immunoglobulins of IgG, IgM and IgA, and Aspergillus IgG, Quantiferon gold  - Depending on these results we'll plan for bronchoscopy with lavage for microbiology sample  Follow-up - We will call you with the blood test results.

## 2015-12-15 LAB — IGE: IGE (IMMUNOGLOBULIN E), SERUM: 11 kU/L (ref <115)

## 2015-12-15 LAB — CYCLIC CITRUL PEPTIDE ANTIBODY, IGG: Cyclic Citrullin Peptide Ab: <16 Units

## 2015-12-15 LAB — ANA: Anti Nuclear Antibody(ANA): NEGATIVE

## 2015-12-16 LAB — QUANTIFERON TB GOLD ASSAY (BLOOD)
Interferon Gamma Release Assay: NEGATIVE
Mitogen-Nil: 5 IU/mL
QUANTIFERON NIL VALUE: 0.14 [IU]/mL

## 2015-12-19 LAB — ALPHA-1 ANTITRYPSIN PHENOTYPE: A-1 Antitrypsin: 158 mg/dL (ref 83–199)

## 2015-12-19 LAB — IGG, IGA, IGM
IgA: 390 mg/dL — ABNORMAL HIGH (ref 69–380)
IgG (Immunoglobin G), Serum: 1190 mg/dL (ref 690–1700)
IgM, Serum: 94 mg/dL (ref 52–322)

## 2015-12-20 ENCOUNTER — Telehealth: Payer: Self-pay | Admitting: Internal Medicine

## 2015-12-20 NOTE — Telephone Encounter (Signed)
Let Chelsea SmolderBonnie G Boies know that all blood test for bronchiectasis normal which means non-contributory to cause (though one ASpergillus IgG is pending but I expect that to come normal given others are normal). Next phase is bronch + BAL. I can do it sometime next  2 weeks

## 2015-12-21 LAB — ALLERGEN A FUMIGATUS IGG

## 2015-12-21 NOTE — Telephone Encounter (Signed)
Called and spoke to pt. Informed him of the results and recs per MR. Pt verbalized understanding and states she would rather speak with MR again about bronch before moving forward.   MR please advise. Thanks.

## 2015-12-22 ENCOUNTER — Encounter: Payer: Self-pay | Admitting: Internal Medicine

## 2015-12-22 NOTE — Progress Notes (Signed)
Quick Note:  Already spoke to pt. See phone note from 3.15.17. Will sign off. ______

## 2015-12-22 NOTE — Telephone Encounter (Signed)
MyChart message from patient:   I received a follow-up telephone call after my last visit. I have a few questions.        What did the blood tests tell you?    Is bronchoscopy the usual procedure done for the type of bronchiectasis I have?    What kind of information would a bronchoscopy give you?    Would it be done with a fiber optic camera or a light?    Would you be the person doing the bronchoscopy?    If yes, what kind of experience have you had doing this procedure?    Would tissue be taken for a biopsy?    Will I be asleep for the procedure or given relaxants and numbing? I am a nervous patient    Are two years of antibiotics post bronchoscopy a usual treatment? I think you mentioned something about doing this    Would I require additional blood tests? (My arm is still recovering from the last one)    Can the procedure be delayed until after Easter?      MR please advise.  Thanks.

## 2015-12-26 ENCOUNTER — Encounter: Payer: Self-pay | Admitting: Internal Medicine

## 2015-12-26 NOTE — Telephone Encounter (Signed)
   What did the blood tests tell you?    Is bronchoscopy the usual procedure done for the type of bronchiectasis I have?    What kind of information would a bronchoscopy give you?    Would it be done with a fiber optic camera or a light?    Would you be the person doing the bronchoscopy?    If yes, what kind of experience have you had doing this procedure?    Would tissue be taken for a biopsy?    Will I be asleep for the procedure or given relaxants and numbing? I am a nervous patient    Are two years of antibiotics post bronchoscopy a usual treatment? I think you mentioned something about doing this    Would I require additional blood tests? (My arm is still recovering from the last one)    Can the procedure be delayed until after Easter?    Spoke to patient Chelsea SmolderBonnie G Hedgepeth - answered all questions abovbe. Hold off bronch till she goes to [place]. Please give her fu to see me first avail and I will revisit bronch with her at that time  Dr. Kalman ShanMurali Kawon Willcutt, M.D., [facility].P Pulmonary and Critical Care Medicine Staff Physician [facility] [place] [facility] and Critical Care Pager: [phone], If no answer or between  15:00h - 7:00h: call [phone]  12/26/2015 6:09 PM

## 2015-12-26 NOTE — Telephone Encounter (Signed)
See phone message   Dr. Kalman ShanMurali Jaely Silman, M.D., [facility].P Pulmonary and Critical Care Medicine Staff Physician [facility] [place] [facility] and Critical Care Pager: [phone], If no answer or between  15:00h - 7:00h: call [phone]  12/26/2015 6:03 PM

## 2015-12-27 NOTE — Telephone Encounter (Signed)
Dr. Marchelle Gearingamaswamy,  Thank you for your telephone call. I would like to schedule an appointment for the bronchoscopy after January 29, 2016. I do not need another appointment with you to discuss this. You have given me a full description of what will be involved and I would like to schedule this procedure as soon as possible on or after January 29, 2016 as we may be traveling in May. If this is possible, I would appreciate hearing back from your office.   Thank y ou   Chelsea SmokerBonnie Morales       Please advise thanks!

## 2015-12-27 NOTE — Telephone Encounter (Signed)
Pt states that she does not feel another f/u visit is necessary at this time and that she wants to just plan on the Bronch on or after April 24th.  Pt states that she spoke with MR regarding this last night. Will send to MR as FYI.

## 2015-12-29 NOTE — Telephone Encounter (Signed)
pls let her know that I will look at my schedule and get back to her next 10 days. If not call back. Pls send message back to me

## 2015-12-29 NOTE — Telephone Encounter (Signed)
Left message for patient to call back  

## 2016-01-01 NOTE — Telephone Encounter (Signed)
Patient notified of MR's message below. Patient will be traveling from April 18- 25th.  Leaving again in May 4 for another trip.   Patient states she would like to have this taken care of before that time. MR - please advise

## 2016-01-03 NOTE — Telephone Encounter (Signed)
Last week in April - I Am night float and do not try to do elective procedures   So best time is 02/06/16 [facility] - small scope, no tb risk, only bal needed, no fluoro  Let me know when you get her on schedule  Dr. Kalman ShanMurali Eulon Allnutt, M.D., [facility].P Pulmonary and Critical Care Medicine Staff Physician [facility] [place] [facility]monary and Critical Care Pager: [phone], If no answer or between  15:00h - 7:00h: call [phone]  01/03/2016 1:54 PM

## 2016-01-03 NOTE — Telephone Encounter (Signed)
lmtcb x1 for respiratory department to schedule bronch.

## 2016-01-03 NOTE — Telephone Encounter (Signed)
[facility] lmtcb x1 for pt.

## 2016-01-04 NOTE — Telephone Encounter (Signed)
Called spoke with pt. Aware of bronch date/time/location. She verbalized understanding and had no further questions

## 2016-01-04 NOTE — Telephone Encounter (Signed)
Pt cb, [phone]

## 2016-02-06 ENCOUNTER — Other Ambulatory Visit: Payer: Self-pay | Admitting: Dermatology

## 2016-02-07 ENCOUNTER — Ambulatory Visit (HOSPITAL_COMMUNITY)
Admission: RE | Admit: 2016-02-07 | Discharge: 2016-02-07 | Disposition: A | Discharge status: Home / Self Care | Payer: Medicare Other | Source: Ambulatory Visit | Attending: Internal Medicine | Admitting: Internal Medicine

## 2016-02-07 ENCOUNTER — Encounter (HOSPITAL_COMMUNITY): Payer: Self-pay | Admitting: Internal Medicine

## 2016-02-07 ENCOUNTER — Encounter (HOSPITAL_COMMUNITY)
Admission: RE | Disposition: A | Discharge status: Home / Self Care | Payer: Self-pay | Source: Ambulatory Visit | Attending: Internal Medicine

## 2016-02-07 DIAGNOSIS — J479 Bronchiectasis, uncomplicated: Secondary | ICD-10-CM | POA: Diagnosis not present

## 2016-02-07 HISTORY — PX: VIDEO BRONCHOSCOPY: SHX5072

## 2016-02-07 LAB — BODY FLUID CELL COUNT WITH DIFFERENTIAL
Eos, Fluid: 1 %
LYMPHS FL: 3 %
MONOCYTE-MACROPHAGE-SEROUS FLUID: 26 % — AB (ref 50–90)
NEUTROPHIL FLUID: 70 % — AB (ref 0–25)
WBC FLUID: 2240 uL — AB (ref 0–1000)

## 2016-02-07 LAB — PNEUMOCYSTIS JIROVECI SMEAR BY DFA: Pneumocystis jiroveci Ag: NEGATIVE

## 2016-02-07 SURGERY — VIDEO BRONCHOSCOPY WITHOUT FLUORO
Anesthesia: Moderate Sedation | Laterality: Bilateral

## 2016-02-07 MED ORDER — LIDOCAINE HCL 2 % EX GEL
CUTANEOUS | Status: DC | PRN
  Administered 2016-02-07: 1

## 2016-02-07 MED ORDER — PHENYLEPHRINE HCL 0.25 % NA SOLN
NASAL | Status: DC | PRN
  Administered 2016-02-07: 2 via NASAL

## 2016-02-07 MED ORDER — MIDAZOLAM HCL 5 MG/ML IJ SOLN
INTRAMUSCULAR | Status: AC
  Filled 2016-02-07: qty 2

## 2016-02-07 MED ORDER — SODIUM CHLORIDE 0.9 % IV SOLN
INTRAVENOUS | Status: DC
  Administered 2016-02-07: 09:00:00 via INTRAVENOUS

## 2016-02-07 MED ORDER — BUTAMBEN-TETRACAINE-BENZOCAINE 2-2-14 % EX AERO: 1.0000 | INHALATION_SPRAY | Freq: Once | CUTANEOUS | Status: DC

## 2016-02-07 MED ORDER — LIDOCAINE HCL 2 % EX GEL: 1.0000 "application " | Freq: Once | CUTANEOUS | Status: DC

## 2016-02-07 MED ORDER — PHENYLEPHRINE HCL 0.25 % NA SOLN: 1.0000 | Freq: Four times a day (QID) | NASAL | Status: DC | PRN

## 2016-02-07 MED ORDER — FENTANYL CITRATE (PF) 100 MCG/2ML IJ SOLN
INTRAMUSCULAR | Status: DC | PRN
  Administered 2016-02-07: 50 ug via INTRAVENOUS

## 2016-02-07 MED ORDER — MIDAZOLAM HCL 10 MG/2ML IJ SOLN
INTRAMUSCULAR | Status: DC | PRN
  Administered 2016-02-07: 2 mg via INTRAVENOUS

## 2016-02-07 MED ORDER — LIDOCAINE HCL 1 % IJ SOLN
INTRAMUSCULAR | Status: DC | PRN
  Administered 2016-02-07: 6 mL via RESPIRATORY_TRACT

## 2016-02-07 MED ORDER — FENTANYL CITRATE (PF) 100 MCG/2ML IJ SOLN
INTRAMUSCULAR | Status: AC
  Filled 2016-02-07: qty 4

## 2016-02-07 NOTE — Progress Notes (Signed)
Video bronchoscopy performed Intervention bronchial washings Pt tolerated well  Selia Wareing David RRT  

## 2016-02-07 NOTE — H&P (Signed)
Preop note   S: > 30d sinsce I last saw patient. Travelled to OR in between. Overall stable. Still with mild mod daily cough and few kleenex yellow sputum. No other issues. NPO confirmed  O: vitals reviewed CTA bilaterally BP 151/78 mmHg  Temp(Src) 97.7 F (36.5 C) (Oral)  Resp 12  SpO2 100%  General Appearance:    Alert, cooperative, no distress, appears stated age  Head:    Normocephalic, without obvious abnormality, atraumatic  Eyes:    PERRL, conjunctiva/corneas clear, EOM's intact, fundi    benign, both eyes  Ears:    Normal TM's and external ear canals, both ears  Nose:   Nares normal, septum midline, mucosa normal, no drainage    or sinus tenderness  Throat:   Lips, mucosa, and tongue normal; teeth and gums normal  Neck:   Supple, symmetrical, trachea midline, no adenopathy;    thyroid:  no enlargement/tenderness/nodules; no carotid   bruit or JVD  Back:     Symmetric, no curvature, ROM normal, no CVA tenderness  Lungs:     Clear to auscultation bilaterally, respirations unlabored  Chest Wall:    No tenderness or deformity   Heart:    Regular rate and rhythm, S1 and S2 normal, no murmur, rub   or gallop  Breast Exam:    Not done  Abdomen:     Soft, non-tender, bowel sounds active all four quadrants,    no masses, no organomegaly  Genitalia:  Not done  Rectal:   not done  Extremities:   Extremities normal, atraumatic, no cyanosis or edema  Pulses:   2+ and symmetric all extremities  Skin:   Skin color, texture, turgor normal, no rashes or lesions  Lymph nodes:   Cervical, supraclavicular, and axillary nodes normal  Neurologic:   CNII-XII intact, normal strength, sensation and reflexes    throughout     A: Bronchiectasis  P: Risks of pneumothorax, hemothorax, sedation/anesthesia complications such as cardiac or respiratory arrest or hypotension, stroke and bleeding all explained. Benefits of diagnosis but limitations of non-diagnosis also explained. Patient  verbalized understanding and wished to proceed.   PLAN for BAL RML v Lingula   Dr. Kalman ShanMurali Colandra Ohanian, M.D., [facility] Pulmonary and Critical Care Medicine Staff Physician [facility]monary and Critical Care Pager: [phone], If no answer or between  15:00h - 7:00h: call [phone]  02/07/2016 9:24 AM

## 2016-02-07 NOTE — Discharge Instructions (Signed)
Flexible Bronchoscopy, Care After These instructions give you information on caring for yourself after your procedure. Your doctor may also give you more specific instructions. Call your doctor if you have any problems or questions after your procedure. HOME CARE  Do not eat or drink anything for 2 hours after your procedure. If you try to eat or drink before the medicine wears off, food or drink could go into your lungs. You could also burn yourself.  After 2 hours have passed and when you can cough and gag normally, you may eat soft food and drink liquids slowly.  The day after the test, you may eat your normal diet.  You may do your normal activities.  Keep all doctor visits. GET HELP RIGHT AWAY IF:  You get more and more short of breath.  You get light-headed.  You feel like you are going to pass out (faint).  You have chest pain.  You have new problems that worry you.  You cough up more than a little blood.  You cough up more blood than before. MAKE SURE YOU:  Understand these instructions.  Will watch your condition.  Will get help right away if you are not doing well or get worse.   This information is not intended to replace advice given to you by your health care provider. Make sure you discuss any questions you have with your health care provider.   Document Released: 07/21/2009 Document Revised: 09/28/2013 Document Reviewed: 05/28/2013 Elsevier Interactive Patient Education [employer]2016 Elsevier Inc.  Do not eat or drink until after 1130 today 02/07/16   FOLLOWUP Future Appointments Date Time Provider Department Center  02/29/2016 10:30 AM Kalman ShanMurali Berlin Mokry, MD [facility] None

## 2016-02-07 NOTE — Op Note (Signed)
Name:  Marjorie SmolderBonnie G Manville MRN:  [id number] DOB:  1940/12/27  PROCEDURE NOTE  Procedure(s): Flexible bronchoscopy [phone]([id number]) Bronchial alveolar lavage [phone]) of the right middle lobe  Indications: bialteral bronchiectasis chronic with chronic cough and sputum.  Consent:  Procedure, benefits, risks and alternatives discussed.  Questions answered.  Consent obtained.  Anesthesia:  Moderate sedation  Procedure summary:  Appropriate equipment was assembled.  The patient was brought to the endoscopy suite room and identified as Marjorie SmolderBonnie G Picklesimer.  Safety timeout was performed. The patient was placed supine on the operating table, airway established and moderate sedation was established with fentanyl 50mcg and versed 2mg   After the appropriate level of sedation was assured, flexible video bronchoscope was lubricated and inserted through the endotracheal tube.  Total of 25 mL of 1% Lidocaine were administered through the bronchoscope to augment general anesthesia.  Airway examination was performed bilaterally to subsegmental level.  Minimal clear secretions were noted, mucosa appeared normal and no endobronchial lesions were identified.  Bronchial alveolar lavage of the right middle lobe was performed with 120 mL of normal saline and return of 60 mL of fluid, after which the bronchoscope was withdrawn. DARK GREY RETURNS NOTED     After hemostasis was assure, the bronchoscope was withdrawn.   Specimens sent: Bronchial alveolar lavage specimen of the RIGHT MIDDLE LOBE for cell count, microbiology and cytology.  Complications:  No immediate complications were noted.  Hemodynamic parameters and oxygenation remained stable throughout the procedure.  Estimated blood loss:none  IMPRESSION  1. Normal airway exam 2. Abnormal looking BAL returns right middle lobe -dark grey   PLAN  -recover - will call with results  Dr. Kalman ShanMurali Jaana Brodt, M.D., [facility].P Pulmonary and Critical Care Medicine Staff  Physician [facility]monary and Critical Care Pager: [phone], If no answer or between  15:00h - 7:00h: call [phone]  02/07/2016 9:38 AM

## 2016-02-08 LAB — ACID FAST SMEAR (AFB): ACID FAST SMEAR - AFSCU2: NEGATIVE

## 2016-02-08 LAB — ACID FAST SMEAR (AFB, MYCOBACTERIA)

## 2016-02-11 ENCOUNTER — Encounter (HOSPITAL_COMMUNITY): Payer: Self-pay | Admitting: Internal Medicine

## 2016-02-12 LAB — CULTURE, BAL-QUANTITATIVE: SPECIAL REQUESTS: NORMAL

## 2016-02-12 LAB — CULTURE, BAL-QUANTITATIVE W GRAM STAIN

## 2016-02-16 ENCOUNTER — Other Ambulatory Visit: Payer: Self-pay | Admitting: *Deleted

## 2016-02-16 ENCOUNTER — Telehealth: Payer: Self-pay | Admitting: Internal Medicine

## 2016-02-16 MED ORDER — CIPROFLOXACIN HCL 500 MG PO TABS: 750.0000 mg | ORAL_TABLET | Freq: Two times a day (BID) | ORAL | Status: DC

## 2016-02-16 NOTE — Telephone Encounter (Signed)
BAL from 02/07/16 is growing pseudomonas    Plan cipro 750mg  twice daily x 14 days  Keep ov  follow up visit - 02/29/16   Allergies  Allergen Reactions  . Sulfa Antibiotics Anaphylaxis

## 2016-02-16 NOTE — Telephone Encounter (Signed)
Pt is aware of the below information. Rx has been sent in. Nothing further was needed. 

## 2016-02-29 ENCOUNTER — Encounter: Payer: Self-pay | Admitting: Internal Medicine

## 2016-02-29 ENCOUNTER — Ambulatory Visit (INDEPENDENT_AMBULATORY_CARE_PROVIDER_SITE_OTHER): Payer: Medicare Other | Admitting: Internal Medicine

## 2016-02-29 VITALS — BP 106/60 | HR 64 | Ht 65.0 in | Wt 125.2 lb

## 2016-02-29 DIAGNOSIS — Z2239 Carrier of other specified bacterial diseases: Secondary | ICD-10-CM | POA: Insufficient documentation

## 2016-02-29 DIAGNOSIS — J479 Bronchiectasis, uncomplicated: Secondary | ICD-10-CM | POA: Insufficient documentation

## 2016-02-29 MED ORDER — FLUCONAZOLE 150 MG PO TABS: 150.0000 mg | ORAL_TABLET | Freq: Every day | ORAL | Status: AC

## 2016-02-29 NOTE — Addendum Note (Signed)
Addended by: Marvene StaffOX, Taheera Thomann H on: 02/29/2016 11:32 AM   Modules accepted: Orders

## 2016-02-29 NOTE — Progress Notes (Signed)
Subjective:     Patient ID: Chelsea Morales, female   DOB: 1941/03/07, 75 y.o.   MRN: [id number]  HPI    PCP Allean FoundSMITH,CANDACE THIELE, MD   HPI   OV 12/14/2015  Chief Complaint  Patient presents with  . Pulmonary Consult    Pt changing providers from MW to MR. Pt states her breathing has not changed since last OV in 07/2015 with MW. Pt c/o prod cough with yellow mucus. Pt denies SOB and CP/tightness.     75 year old female. Transfer of care from Dr. Sherene Sireswert to myself Dr. Marchelle Gearingamaswamy for bronchiectasis.   from [place] but has lived in [place] years. [place] and in 2014 she visited her and suffered pneumonia. Prior to that she had no respiratory problems. Following recovery from the pneumonia she's had chronic cough which still persists without change. The cough is better when she is sitting erect but when she lies down she has several episodes and brings out at least 3-5 tissue full of yellow sputum. In the last 3 years she's had 3 course of antibiotics/prednisone. This as best that she can recollect. There are no new admissions. She has lost some weight recently but this is intentional following walking. She does not get dyspneic or wheeze at any time including during walking. Overall symptom burden is mild but she finds the cough and sputum disgusting and she wants to understand why.  May 2016 CT scan of the chest personally visualized shows right middle lobe and lingular bronchiectasis with some bronchiectasis in the left lower lobe.  Pulmonary function test to determine to be normal per chart review.   OV 02/29/2016  Chief Complaint  Patient presents with  . Follow-up    Review Bronch results. denies any current breathing issues today.     Follow-up right middle lobe and lingular bronchiectasis. She underwent bronchoscopy with lavage 02/07/2016. Results showed Pseudomonas fluorescence. She is now on day 13 out of 14 of high-dose  ciprofloxacin. With this the cough and sputum production that her chronic have resolved. She feels she's having some dry mouth/oral sores with the ciprofloxacin. Otherwise feels good. We discussed the results.  some concerns about blood pressure. But the blood pressure was checked with her shirt on. We rechecked it and the systolic was in the 100s and diastolic 60s      has a past medical history of Bronchiectasis (HCC); Ruptured ear drum; Hematuria; and Kidney stones.   reports that she quit smoking about 56 years ago. Her smoking use included Cigarettes. She has a .25 pack-year smoking history. She has never used smokeless tobacco.  Past Surgical History  Procedure Laterality Date  . Oophorectomy    . Video bronchoscopy Bilateral 02/07/2016    Procedure: VIDEO BRONCHOSCOPY WITHOUT FLUORO;  Surgeon: Kalman ShanMurali Deena Shaub, MD;  Location: [facility];  Service: Cardiopulmonary;  Laterality: Bilateral;    Allergies  Allergen Reactions  . Sulfa Antibiotics Anaphylaxis    Immunization History  Administered Date(s) Administered  . Influenza,inj,Quad PF,36+ Mos 06/24/2014, 07/28/2015  . Pneumococcal Conjugate-13 06/24/2014    Family History  Problem Relation Age of Onset  . Heart failure Brother   . Breast cancer Mother   . Colon cancer Mother   . Heart attack Father      Current outpatient prescriptions:  .  ciprofloxacin (CIPRO) 500 MG tablet, Take 1.5 tablets (750 mg total) by mouth 2 (two) times daily., Disp: 42 tablet, Rfl: 0 .  Respiratory Therapy Supplies (  FLUTTER) DEVI, Use as directed, Disp: 1 each, Rfl: 0     Review of Systems     Objective:   Physical Exam  Filed Vitals:   02/29/16 1048  BP: 92/56  Pulse: 64  Height:  (1.651 m)  Weight: 125 lb 3.2 oz (56.79 kg)  SpO2: 96%   Discussion only visit. Focused exam shows clear lung fields. No oral ulcers.     Assessment:       ICD-9-CM ICD-10-CM   1. 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 Kalman Shan, M.D., [facility].P Pulmonary and Critical Care Medicine Staff Physician [facility] Pulmonary and Critical Care Pager: [phone], If no answer or between  15:00h - 7:00h: call [phone]  02/29/2016 11:14 AM

## 2016-02-29 NOTE — Patient Instructions (Addendum)
ICD-9-CM ICD-10-CM   1. 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

## 2016-03-22 LAB — ACID FAST CULTURE WITH REFLEXED SENSITIVITIES (MYCOBACTERIA): Acid Fast Culture: NEGATIVE

## 2016-09-05 ENCOUNTER — Ambulatory Visit (INDEPENDENT_AMBULATORY_CARE_PROVIDER_SITE_OTHER): Payer: Medicare Other | Admitting: Internal Medicine

## 2016-09-05 ENCOUNTER — Encounter: Payer: Self-pay | Admitting: Internal Medicine

## 2016-09-05 VITALS — BP 124/60 | HR 77 | Ht 65.0 in | Wt 129.6 lb

## 2016-09-05 DIAGNOSIS — J479 Bronchiectasis, uncomplicated: Secondary | ICD-10-CM | POA: Diagnosis not present

## 2016-09-05 DIAGNOSIS — Z23 Encounter for immunization: Secondary | ICD-10-CM

## 2016-09-05 NOTE — Patient Instructions (Addendum)
ICD-9-CM ICD-10-CM   1. Bronchiectasis without complication (HCC) 494.0 J47.9     Mild symptomatology which is baseline Agree will hold off inhaler for the mild level symptoms Use mucinex as needed to ensure clearance of mucus Use flutter valve High dose flu shot 09/05/2016  Followup  - 9 months or sooner if needed

## 2016-09-05 NOTE — Progress Notes (Signed)
Subjective:     Patient ID: Chelsea Morales, female   DOB: Jun 02, 1941, 75 y.o.   MRN: [id number]  HPI    PCP Allean FoundSMITH,CANDACE THIELE, MD   HPI   OV 12/14/2015  Chief Complaint  Patient presents with  . Pulmonary Consult    Pt changing providers from MW to MR. Pt states her breathing has not changed since last OV in 07/2015 with MW. Pt c/o prod cough with yellow mucus. Pt denies SOB and CP/tightness.     75 year old female. Transfer of care from Dr. Sherene Sireswert to myself Dr. Marchelle Gearingamaswamy for bronchiectasis.   from [place] in [place] years. [place] and in 2014 she visited her and suffered pneumonia. Prior to that she had no respiratory problems. Following recovery from the pneumonia she's had chronic cough which still persists without change. The cough is better when she is sitting erect but when she lies down she has several episodes and brings out at least 3-5 tissue full of yellow sputum. In the last 3 years she's had 3 course of antibiotics/prednisone. This as best that she can recollect. There are no new admissions. She has lost some weight recently but this is intentional following walking. She does not get dyspneic or wheeze at any time including during walking. Overall symptom burden is mild but she finds the cough and sputum disgusting and she wants to understand why.  May 2016 CT scan of the chest personally visualized shows right middle lobe and lingular bronchiectasis with some bronchiectasis in the left lower lobe.  Pulmonary function test to determine to be normal per chart review.   OV 02/29/2016  Chief Complaint  Patient presents with  . Follow-up    Review Bronch results. denies any current breathing issues today.     Follow-up right middle lobe and lingular bronchiectasis. She underwent bronchoscopy with lavage 02/07/2016. Results showed Pseudomonas fluorescence. She is now on day 13 out of 14 of high-dose  ciprofloxacin. With this the cough and sputum production that her chronic have resolved. She feels she's having some dry mouth/oral sores with the ciprofloxacin. Otherwise feels good. We discussed the results.  some concerns about blood pressure. But the blood pressure was checked with her shirt on. We rechecked it and the systolic was in the 100s and diastolic 60s     OV 09/05/2016  Chief Complaint  Patient presents with  . Follow-up    40mo rov. pt states breathing is doing well. pt c/o mild prod cough with white to yellow mucus. pt denies any sob, wheezing or chest dicomfort.    Follow-up right middle lobe and lingular bronchiectasis. She underwent bronchoscopy with lavage 02/07/2016. Results showed Pseudomonas fluorescence  She is following up after May 2017. In the interim she's only had very mild cough. Cough is rated at level II out of 10. It is mostly at night. The sputum is clear and thin. She uses the flutter valve only occasionally when he does help. She does not use any inhalers. She does not feel like she needs any inhalers for her level of symptomatology. She has no dyspnea or chest tightness or wheezing. She is not interested in inhaler therapy. She is open to trying Mucinex. She will have a flu shot today.    has a past medical history of Bronchiectasis (HCC); Hematuria; Kidney stones; and Ruptured ear drum.   reports that she quit smoking about 56 years ago. Her smoking use included Cigarettes.  She has a 0.25 pack-year smoking history. She has never used smokeless tobacco.  Past Surgical History:  Procedure Laterality Date  . OOPHORECTOMY    . VIDEO BRONCHOSCOPY Bilateral 02/07/2016   Procedure: VIDEO BRONCHOSCOPY WITHOUT FLUORO;  Surgeon: Kalman ShanMurali Anivea Velasques, MD;  Location: [facility];  Service: Cardiopulmonary;  Laterality: Bilateral;    Allergies  Allergen Reactions  . Sulfa Antibiotics Anaphylaxis    Immunization History  Administered Date(s) Administered  .  Influenza,inj,Quad PF,36+ Mos 06/24/2014, 07/28/2015  . Pneumococcal Conjugate-13 06/24/2014    Family History  Problem Relation Age of Onset  . Heart failure Brother   . Breast cancer Mother   . Colon cancer Mother   . Heart attack Father      Current Outpatient Prescriptions:  .  Respiratory Therapy Supplies (FLUTTER) DEVI, Use as directed, Disp: 1 each, Rfl: 0    Review of Systems     Objective:   Physical Exam  Constitutional: She is oriented to person, place, and time. She appears well-developed and well-nourished. No distress.  HENT:  Head: Normocephalic and atraumatic.  Right Ear: External ear normal.  Left Ear: External ear normal.  Mouth/Throat: Oropharynx is clear and moist. No oropharyngeal exudate.  Eyes: Conjunctivae and EOM are normal. Pupils are equal, round, and reactive to light. Right eye exhibits no discharge. Left eye exhibits no discharge. No scleral icterus.  Neck: Normal range of motion. Neck supple. No JVD present. No tracheal deviation present. No thyromegaly present.  Cardiovascular: Normal rate, regular rhythm, normal heart sounds and intact distal pulses.  Exam reveals no gallop and no friction rub.   No murmur heard. Pulmonary/Chest: Effort normal and breath sounds normal. No respiratory distress. She has no wheezes. She has no rales. She exhibits no tenderness.  Abdominal: Soft. Bowel sounds are normal. She exhibits no distension and no mass. There is no tenderness. There is no rebound and no guarding.  Musculoskeletal: Normal range of motion. She exhibits no edema or tenderness.  Lymphadenopathy:    She has no cervical adenopathy.  Neurological: She is alert and oriented to person, place, and time. She has normal reflexes. No cranial nerve deficit. She exhibits normal muscle tone. Coordination normal.  Skin: Skin is warm and dry. No rash noted. She is not diaphoretic. No erythema. No pallor.  Psychiatric: She has a normal mood and affect. Her  behavior is normal. Judgment and thought content normal.  Vitals reviewed.   Vitals:   09/05/16 1323  BP: 124/60  Pulse: 77   Vitals:   09/05/16 1323  BP: 124/60  Pulse: 77  SpO2: 96%  Weight: 129 lb 9.6 oz (58.8 kg)  Height: 5\' 5"  (1.651 m)        Assessment:       ICD-9-CM ICD-10-CM   1. Bronchiectasis without complication (HCC) 494.0 J47.9        Plan:       Mild symptomatology which is baseline Agree will hold off inhaler for the mild level symptoms Use mucinex as needed to ensure clearance of mucus Use flutter valve High dose flu shot 09/05/2016  Followup  - 9 months or sooner if needed   Dr. Kalman ShanMurali Arizbeth Cawthorn, M.D., [facility].P Pulmonary and Critical Care Medicine Staff Physician [facility] Pulmonary and Critical Care Pager: [phone], If no answer or between  15:00h - 7:00h: call [phone]  09/05/2016 1:49 PM

## 2016-11-08 ENCOUNTER — Other Ambulatory Visit: Payer: Self-pay | Admitting: Family Medicine

## 2016-11-08 DIAGNOSIS — Z1231 Encounter for screening mammogram for malignant neoplasm of breast: Secondary | ICD-10-CM

## 2016-12-13 ENCOUNTER — Ambulatory Visit
Admission: RE | Admit: 2016-12-13 | Discharge: 2016-12-13 | Disposition: A | Discharge status: Home / Self Care | Payer: Medicare Other | Source: Ambulatory Visit | Attending: Family Medicine | Admitting: Family Medicine

## 2016-12-13 DIAGNOSIS — Z1231 Encounter for screening mammogram for malignant neoplasm of breast: Secondary | ICD-10-CM

## 2017-06-05 ENCOUNTER — Ambulatory Visit (INDEPENDENT_AMBULATORY_CARE_PROVIDER_SITE_OTHER): Payer: Medicare Other | Admitting: Internal Medicine

## 2017-06-05 ENCOUNTER — Encounter: Payer: Self-pay | Admitting: Internal Medicine

## 2017-06-05 VITALS — BP 116/72 | HR 74 | Ht 65.0 in | Wt 125.2 lb

## 2017-06-05 DIAGNOSIS — Z23 Encounter for immunization: Secondary | ICD-10-CM

## 2017-06-05 DIAGNOSIS — J479 Bronchiectasis, uncomplicated: Secondary | ICD-10-CM

## 2017-06-05 NOTE — Progress Notes (Signed)
Subjective:     Patient ID: Chelsea Morales, female   DOB: 1941-04-15, 76 y.o.   MRN: [id number]  HPI PCP [name], MD   HPI   OV 12/14/2015  Chief Complaint  Patient presents with  . Pulmonary Consult    Pt changing providers from MW to MR. Pt states her breathing has not changed since last OV in 07/2015 with MW. Pt c/o prod cough with yellow mucus. Pt denies SOB and CP/tightness.     76 year old female. Transfer of care from Dr. Sherene Sires to myself Dr. Marchelle Gearing for bronchiectasis.   from [place] but has lived in [place] for 40 years. Her daughter moved to [facility] [place] and in 2014 she visited her and suffered pneumonia. Prior to that she had no respiratory problems. Following recovery from the pneumonia she's had chronic cough which still persists without change. The cough is better when she is sitting erect but when she lies down she has several episodes and brings out at least 3-5 tissue full of yellow sputum. In the last 3 years she's had 3 course of antibiotics/prednisone. This as best that she can recollect. There are no new admissions. She has lost some weight recently but this is intentional following walking. She does not get dyspneic or wheeze at any time including during walking. Overall symptom burden is mild but she finds the cough and sputum disgusting and she wants to understand why.  May 2016 CT scan of the chest personally visualized shows right middle lobe and lingular bronchiectasis with some bronchiectasis in the left lower lobe.  Pulmonary function test to determine to be normal per chart review.   OV 02/29/2016  Chief Complaint  Patient presents with  . Follow-up    Review Bronch results. denies any current breathing issues today.     Follow-up right middle lobe and lingular bronchiectasis. She underwent bronchoscopy with lavage 02/07/2016. Results showed Pseudomonas fluorescence. She is now on day 13 out of 14 of high-dose ciprofloxacin.  With this the cough and sputum production that her chronic have resolved. She feels she's having some dry mouth/oral sores with the ciprofloxacin. Otherwise feels good. We discussed the results.  some concerns about blood pressure. But the blood pressure was checked with her shirt on. We rechecked it and the systolic was in the 100s and diastolic 60s     OV 09/05/2016  Chief Complaint  Patient presents with  . Follow-up    65mo rov. pt states breathing is doing well. pt c/o mild prod cough with white to yellow mucus. pt denies any sob, wheezing or chest dicomfort.    Follow-up right middle lobe and lingular bronchiectasis. She underwent bronchoscopy with lavage 02/07/2016. Results showed Pseudomonas fluorescence  She is following up after May 2017. In the interim she's only had very mild cough. Cough is rated at level II out of 10. It is mostly at night. The sputum is clear and thin. She uses the flutter valve only occasionally when he does help. She does not use any inhalers. She does not feel like she needs any inhalers for her level of symptomatology. She has no dyspnea or chest tightness or wheezing. She is not interested in inhaler therapy. She is open to trying Mucinex. She will have a flu shot today.   OV 06/05/2017  Chief Complaint  Patient presents with  . Follow-up    Pt has presistant productive cough with yellow thick mucus each night for last few months. Pt is having trouble getting  to sleep.    Follow-up right middle lobe and lingular bronchiectasis. Status post bronchoscopy with lavage May 2017 with growth of Pseudomonas fluorescence. Normal lung function in October 2016. She is only on as needed symptomatically therapy but mostly on observation only therapy    Last visit November 2017. After that she's been doing well. For the last 3 or 4 months she's had cough and mucus at night that is slightly increased compared to baseline. She says that she coughs it out mucus is clear  but at the end it becomes yellow. It is only small in amount. But it is a definite change for the worse. Because of this she has some difficulty initiating sleep but then after that she sleeps well. Denies any dyspnea or chest tightness and wheezing or any daytime problems. She is open to trying Mucinex or taking a steam inhalation. But beyond that she does not want to do inhaler therapy or any other medications. She does not feel flareup.      has a past medical history of Bronchiectasis (HCC); Hematuria; Kidney stones; and Ruptured ear drum.   reports that she quit smoking about 57 years ago. Her smoking use included Cigarettes. She has a 0.25 pack-year smoking history. She has never used smokeless tobacco.  Past Surgical History:  Procedure Laterality Date  . BREAST BIOPSY    . OOPHORECTOMY    . VIDEO BRONCHOSCOPY Bilateral 02/07/2016   Procedure: VIDEO BRONCHOSCOPY WITHOUT FLUORO;  Surgeon: Kalman Shan, MD;  Location: [facility];  Service: Cardiopulmonary;  Laterality: Bilateral;    Allergies  Allergen Reactions  . Sulfa Antibiotics Anaphylaxis    Immunization History  Administered Date(s) Administered  . Influenza, High Dose Seasonal PF 09/05/2016  . Influenza,inj,Quad PF,6+ Mos 06/24/2014, 07/28/2015  . Pneumococcal Conjugate-13 06/24/2014    Family History  Problem Relation Age of Onset  . Heart failure Brother   . Breast cancer Mother   . Colon cancer Mother   . Heart attack Father   . Breast cancer Maternal Grandmother      Current Outpatient Prescriptions:  .  calcium-vitamin D 250-100 MG-UNIT tablet, Take 2 tablets by mouth 2 (two) times daily., Disp: , Rfl:  .  magnesium 30 MG tablet, Take 30 mg by mouth 2 (two) times daily., Disp: , Rfl:  .  Respiratory Therapy Supplies (FLUTTER) DEVI, Use as directed, Disp: 1 each, Rfl: 0    Review of Systems     Objective:   Physical Exam  Constitutional: She is oriented to person, place, and time. She appears  well-developed and well-nourished. No distress.  HENT:  Head: Normocephalic and atraumatic.  Right Ear: External ear normal.  Left Ear: External ear normal.  Mouth/Throat: Oropharynx is clear and moist. No oropharyngeal exudate.  Mild post nasal drip +  Eyes: Pupils are equal, round, and reactive to light. Conjunctivae and EOM are normal. Right eye exhibits no discharge. Left eye exhibits no discharge. No scleral icterus.  Neck: Normal range of motion. Neck supple. No JVD present. No tracheal deviation present. No thyromegaly present.  Cardiovascular: Normal rate, regular rhythm, normal heart sounds and intact distal pulses.  Exam reveals no gallop and no friction rub.   No murmur heard. Pulmonary/Chest: Effort normal and breath sounds normal. No respiratory distress. She has no wheezes. She has no rales. She exhibits no tenderness.  Abdominal: Soft. Bowel sounds are normal. She exhibits no distension and no mass. There is no tenderness. There is no rebound and no guarding.  Musculoskeletal: Normal range of motion. She exhibits no edema or tenderness.  Lymphadenopathy:    She has no cervical adenopathy.  Neurological: She is alert and oriented to person, place, and time. She has normal reflexes. No cranial nerve deficit. She exhibits normal muscle tone. Coordination normal.  Skin: Skin is warm and dry. No rash noted. She is not diaphoretic. No erythema. No pallor.  Psychiatric: She has a normal mood and affect. Her behavior is normal. Judgment and thought content normal.  Vitals reviewed.  Vitals:   06/05/17 1025  BP: 116/72  Pulse: 74  SpO2: 97%  Weight: 125 lb 3.2 oz (56.8 kg)  Height: 5\' 5"  (1.651 m)    Estimated body mass index is 20.83 kg/m as calculated from the following:   Height as of this encounter: 5\' 5"  (1.651 m).   Weight as of this encounter: 125 lb 3.2 oz (56.8 kg).     Assessment:       ICD-10-CM   1. Bronchiectasis without complication (HCC) J47.9         Plan:      It seems in the last few months you  might have more mucus At this point respect decision not to try any inhaler therapy You can try Mucinex daily to clear up the phlegm In addition you can also try some steam bath or hot shower in the evenings to clear up the mucus  If these do not work trying nebulized saline might be an option but you have to call us for that High dose flu shot today 06/05/2017 Any concerns for flareup of acute bronchitis please call us  Follow-up - 9 months or sooner if needed     Dr. Kalman ShanMurali Janissa Bertram, M.D., [facility].C.C.P Pulmonary and Critical Care Medicine Staff Physician [facility] [facility] and Critical [facility] Pager: [phone], If no answer or between  15:00h - 7:00h: call [phone]  06/05/2017 10:43 AM

## 2017-06-05 NOTE — Addendum Note (Signed)
Addended by: Kalman ShanAMASWAMY, Kira Hartl on: 06/05/2017 10:48 AM   Modules accepted: Level of Service

## 2017-06-05 NOTE — Patient Instructions (Addendum)
ICD-10-CM   1. Bronchiectasis without complication (HCC) J47.9     It seems in the last few months you  might have more mucus At this point respect decision not to try any inhaler therapy You can try Mucinex twice  Daily as needed to clear up the phlegm In addition you can also try some steam bath or hot shower in the evenings to clear up the mucus  If these do not work trying nebulized saline might be an option but you have to call us for that High dose flu shot today 06/05/2017 Any concerns for flareup of acute bronchitis please call us  Follow-up - 9 months or sooner if needed

## 2017-10-31 ENCOUNTER — Other Ambulatory Visit: Payer: Self-pay | Admitting: Family Medicine

## 2017-10-31 DIAGNOSIS — Z1231 Encounter for screening mammogram for malignant neoplasm of breast: Secondary | ICD-10-CM

## 2017-12-16 ENCOUNTER — Ambulatory Visit
Admission: RE | Admit: 2017-12-16 | Discharge: 2017-12-16 | Disposition: A | Discharge status: Home / Self Care | Payer: Medicare Other | Source: Ambulatory Visit | Attending: Family Medicine | Admitting: Family Medicine

## 2017-12-16 DIAGNOSIS — Z1231 Encounter for screening mammogram for malignant neoplasm of breast: Secondary | ICD-10-CM

## 2018-07-27 ENCOUNTER — Ambulatory Visit: Payer: Medicare Other | Admitting: Internal Medicine

## 2018-07-27 ENCOUNTER — Ambulatory Visit (INDEPENDENT_AMBULATORY_CARE_PROVIDER_SITE_OTHER)
Admission: RE | Admit: 2018-07-27 | Discharge: 2018-07-27 | Disposition: A | Discharge status: Home / Self Care | Payer: Medicare Other | Source: Ambulatory Visit | Attending: Internal Medicine | Admitting: Internal Medicine

## 2018-07-27 ENCOUNTER — Encounter: Payer: Self-pay | Admitting: Internal Medicine

## 2018-07-27 VITALS — BP 118/70 | HR 72 | Ht 64.5 in | Wt 118.8 lb

## 2018-07-27 DIAGNOSIS — J479 Bronchiectasis, uncomplicated: Secondary | ICD-10-CM

## 2018-07-27 DIAGNOSIS — Z23 Encounter for immunization: Secondary | ICD-10-CM

## 2018-07-27 NOTE — Patient Instructions (Addendum)
ICD-10-CM   1. Bronchiectasis without complication (HCC) J47.9     I think he had a bad flareup 3 weeks ago in [place] and this ended 1 week ago naturally Glad you are currently feeling well and almost asymptomatic  Plan  -Bronchiectasis is no cure but it has to be managed -High-dose flu shot today July 27, 2018 -Also recommend Pneumovax vaccine today July 27, 2018 - Chest x-ray to make sure lung fields are okay = do today July 27, 2018 - You travel especially altitudes let [place] consider you taking a maintenance inhaler to prevent the burden of such flareups -Also for any travel let [place] know and we will give you a handy prescription of an antibiotic and prednisone to be used in similar situation  Follow-up -In 6 months do spirometry and DLCO breathing test [last one was October 2016) -Return to see me in 6 months or sooner if needed

## 2018-07-27 NOTE — Progress Notes (Signed)
PI PCP [name], MD   HPI   OV 12/14/2015  Chief Complaint  Patient presents with  . Pulmonary Consult    Pt changing providers from MW to MR. Pt states her breathing has not changed since last OV in 07/2015 with MW. Pt c/o prod cough with yellow mucus. Pt denies SOB and CP/tightness.     77 year old female. Transfer of care from Dr. Sherene Sires to myself Dr. Marchelle Gearing for bronchiectasis.   from [place] but has lived in [place] for 40 years. Her daughter moved to [facility] [place] and in 2014 she visited her and suffered pneumonia. Prior to that she had no respiratory problems. Following recovery from the pneumonia she's had chronic cough which still persists without change. The cough is better when she is sitting erect but when she lies down she has several episodes and brings out at least 3-5 tissue full of yellow sputum. In the last 3 years she's had 3 course of antibiotics/prednisone. This as best that she can recollect. There are no new admissions. She has lost some weight recently but this is intentional following walking. She does not get dyspneic or wheeze at any time including during walking. Overall symptom burden is mild but she finds the cough and sputum disgusting and she wants to understand why.  May 2016 CT scan of the chest personally visualized shows right middle lobe and lingular bronchiectasis with some bronchiectasis in the left lower lobe.  Pulmonary function test to determine to be normal per chart review.   OV 02/29/2016  Chief Complaint  Patient presents with  . Follow-up    Review Bronch results. denies any current breathing issues today.     Follow-up right middle lobe and lingular bronchiectasis. She underwent bronchoscopy with lavage 02/07/2016. Results showed Pseudomonas fluorescence. She is now on day 13 out of 14 of high-dose ciprofloxacin. With this the cough and sputum production that her chronic have resolved. She feels she's  having some dry mouth/oral sores with the ciprofloxacin. Otherwise feels good. We discussed the results.  some concerns about blood pressure. But the blood pressure was checked with her shirt on. We rechecked it and the systolic was in the 100s and diastolic 60s     OV 09/05/2016  Chief Complaint  Patient presents with  . Follow-up    56mo rov. pt states breathing is doing well. pt c/o mild prod cough with white to yellow mucus. pt denies any sob, wheezing or chest dicomfort.    Follow-up right middle lobe and lingular bronchiectasis. She underwent bronchoscopy with lavage 02/07/2016. Results showed Pseudomonas fluorescence  She is following up after May 2017. In the interim she's only had very mild cough. Cough is rated at level II out of 10. It is mostly at night. The sputum is clear and thin. She uses the flutter valve only occasionally when he does help. She does not use any inhalers. She does not feel like she needs any inhalers for her level of symptomatology. She has no dyspnea or chest tightness or wheezing. She is not interested in inhaler therapy. She is open to trying Mucinex. She will have a flu shot today.   OV 06/05/2017  Chief Complaint  Patient presents with  . Follow-up    Pt has presistant productive cough with yellow thick mucus each night for last few months. Pt is having trouble getting to sleep.    Follow-up right middle lobe and lingular bronchiectasis. Status post bronchoscopy with lavage May 2017 with  growth of Pseudomonas fluorescence. Normal lung function in October 2016. She is only on as needed symptomatically therapy but mostly on observation only therapy    Last visit November 2017. After that she's been doing well. For the last 3 or 4 months she's had cough and mucus at night that is slightly increased compared to baseline. She says that she coughs it out mucus is clear but at the end it becomes yellow. It is only small in amount. But it is a definite change  for the worse. Because of this she has some difficulty initiating sleep but then after that she sleeps well. Denies any dyspnea or chest tightness and wheezing or any daytime problems. She is open to trying Mucinex or taking a steam inhalation. But beyond that she does not want to do inhaler therapy or any other medications. She does not feel flareup.    OV 07/27/2018  Subjective:  Patient ID: Chelsea Morales, female , DOB: 12-18-1940 , age 78 y.o. , MRN: [id number] , ADDRESS: [address] [place] [address]  Follow-up right middle lobe and lingular bronchiectasis. Status post bronchoscopy with lavage May 2017 with growth of Pseudomonas fluorescence. Normal lung function in October 2016. She is only on as needed symptomatically therapy but mostly on observation only therapy   07/27/2018 -   Chief Complaint  Patient presents with  . Follow-up    doing well at this time no problems.     HPI Chelsea Morales 77 y.o. -returns for bronchiectasis.  She prefers to just have supportive care and expectant follow-up.  Last seen over a year ago.  She says that 3 weeks ago she was in [place] and 3 weeks ago she was in the mountains of [place] when she got a sore throat and then started having what sounds like a classic bronchiectasis flare with significant amount of yellow sputum and cough and congestion and wheezing and shortness of breath and chest tightness.  This made her more dyspneic while climbing mountains.  Nevertheless she persisted.  Her symptoms have spontaneously resolved 1 week ago.  For the last 2 days she is now returned to [facility] and is currently jet lag but feeling great.  She feels the sputum production is even better than baseline which is hardly any sputum.  Currently no cough or shortness of breath or wheezing.  She feels great.  She does not feel the need to take daily maintenance inhalers.  She is not taking Mucinex or doing saline nebulizer and a hypertonic format.   She wants to have a flu shot today.     CAT COPD Symptom & Quality of Life Score (GSK trademark) -being used for bronchiectasis 0 is no burden. 5 is highest burden  July 27, 2018 - x last 2 days. This is baseline  Never Cough -> Cough all the time 2  No phlegm in chest -> Chest is full of phlegm 2  No chest tightness -> Chest feels very tight 0  No dyspnea for 1 flight stairs/hill -> Very dyspneic for 1 flight of stairs 1  No limitations for ADL at home -> Very limited with ADL at home 0  Confident leaving home -> Not at all confident leaving home 0  Sleep soundly -> Do not sleep soundly because of lung condition 1  Lots of Energy -> No energy at all 2  TOTAL Score (max 40)  8      ROS - per HPI  has a past medical history of Bronchiectasis (HCC), Hematuria, Kidney stones, and Ruptured ear drum.   reports that she quit smoking about 58 years ago. Her smoking use included cigarettes. She has a 0.25 pack-year smoking history. She has never used smokeless tobacco.  Past Surgical History:  Procedure Laterality Date  . BREAST BIOPSY    . OOPHORECTOMY    . VIDEO BRONCHOSCOPY Bilateral 02/07/2016   Procedure: VIDEO BRONCHOSCOPY WITHOUT FLUORO;  Surgeon: Kalman Shan, MD;  Location: [facility];  Service: Cardiopulmonary;  Laterality: Bilateral;    Allergies  Allergen Reactions  . Sulfa Antibiotics Anaphylaxis    Immunization History  Administered Date(s) Administered  . Influenza, High Dose Seasonal PF 09/05/2016, 06/05/2017  . Influenza,inj,Quad PF,6+ Mos 06/24/2014, 07/28/2015  . Pneumococcal Conjugate-13 06/24/2014    Family History  Problem Relation Age of Onset  . Heart failure Brother   . Breast cancer Mother   . Colon cancer Mother   . Heart attack Father   . Breast cancer Maternal Grandmother      Current Outpatient Medications:  .  calcium-vitamin D 250-100 MG-UNIT tablet, Take 2 tablets by mouth 2 (two) times daily., Disp: , Rfl:  .  magnesium  30 MG tablet, Take 30 mg by mouth 2 (two) times daily., Disp: , Rfl:  .  Respiratory Therapy Supplies (FLUTTER) DEVI, Use as directed, Disp: 1 each, Rfl: 0      Objective:   Vitals:   07/27/18 1102  BP: 118/70  Pulse: 72  SpO2: 98%  Weight: 118 lb 12.8 oz (53.9 kg)  Height: 5' 4.5" (1.638 m)    Estimated body mass index is 20.08 kg/m as calculated from the following:   Height as of this encounter: 5' 4.5" (1.638 m).   Weight as of this encounter: 118 lb 12.8 oz (53.9 kg).  @WEIGHTCHANGE @  [employer]   07/27/18 1102  Weight: 118 lb 12.8 oz (53.9 kg)     Physical Exam  General Appearance:    Alert, cooperative, no distress, appears stated age - yes , Deconditioned looking - no , OBESE  - no, Sitting on Wheelchair -  no  Head:    Normocephalic, without obvious abnormality, atraumatic  Eyes:    PERRL, conjunctiva/corneas clear,  Ears:    Normal TM's and external ear canals, both ears  Nose:   Nares normal, septum midline, mucosa normal, no drainage    or sinus tenderness. OXYGEN ON  - no . Patient is @ ra   Throat:   Lips, mucosa, and tongue normal; teeth and gums normal. Cyanosis on lips - no  Neck:   Supple, symmetrical, trachea midline, no adenopathy;    thyroid:  no enlargement/tenderness/nodules; no carotid   bruit or JVD  Back:     Symmetric, no curvature, ROM normal, no CVA tenderness  Lungs:     Distress - no , Wheeze no, Barrell Chest - no, Purse lip breathing - no, Crackles - no   Chest Wall:    No tenderness or deformity.    Heart:    Regular rate and rhythm, S1 and S2 normal, no rub   or gallop, Murmur - no  Breast Exam:    NOT DONE  Abdomen:     Soft, non-tender, bowel sounds active all four quadrants,    no masses, no organomegaly. Visceral obesity - no  Genitalia:   NOT DONE  Rectal:   NOT DONE  Extremities:   Extremities - normal, Has Cane - no, Clubbing -  no, Edema - no  Pulses:   2+ and symmetric all extremities  Skin:   Stigmata of Connective  Tissue Disease - no  Lymph nodes:   Cervical, supraclavicular, and axillary nodes normal  Psychiatric:  Neurologic:   Pleasant - yes, Anxious - no, Flat affect - no  CAm-ICU - neg, Alert and Oriented x 3 - yes, Moves all 4s - yes, Speech - normal, Cognition - intact           Assessment:       ICD-10-CM   1. Bronchiectasis without complication (HCC) J47.9        Plan:     Patient Instructions     ICD-10-CM   1. Bronchiectasis without complication (HCC) J47.9     I think he had a bad flareup 3 weeks ago in [place] and this ended 1 week ago naturally Glad you are currently feeling well and almost asymptomatic  Plan  -Bronchiectasis is no cure but it has to be managed -High-dose flu shot today July 27, 2018 -Also recommend Pneumovax vaccine today July 27, 2018 - Chest x-ray to make sure lung fields are okay = do today July 27, 2018 - You travel especially altitudes let [place] consider you taking a maintenance inhaler to prevent the burden of such flareups -Also for any travel let [place] know and we will give you a handy prescription of an antibiotic and prednisone to be used in similar situation  Follow-up -In 6 months do spirometry and DLCO breathing test [last one was October 2016) -Return to see me in 6 months or sooner if needed   > 50% of this > 25 min visit spent in face to face counseling or coordination of care - by this undersigned MD - Dr Kalman Shan. This includes one or more of the following documented above: discussion of test results, diagnostic or treatment recommendations, prognosis, risks and benefits of management options, instructions, education, compliance or risk-factor reduction    SIGNATURE    Dr. Kalman Shan, M.D., F.C.C.P,  Pulmonary and Critical Care Medicine Staff Physician, [facility] Director - Interstitial Lung Disease  Program  Pulmonary Fibrosis [facility] [place], [place], [address]  Pager: [phone], If no answer or between  15:00h - 7:00h: call [phone] Telephone: [phone]  11:44 AM 07/27/2018

## 2018-12-17 ENCOUNTER — Other Ambulatory Visit: Payer: Self-pay | Admitting: Family Medicine

## 2018-12-17 DIAGNOSIS — Z1231 Encounter for screening mammogram for malignant neoplasm of breast: Secondary | ICD-10-CM

## 2019-01-14 ENCOUNTER — Ambulatory Visit: Payer: Medicare Other

## 2019-01-21 ENCOUNTER — Ambulatory Visit: Payer: Medicare Other | Admitting: Internal Medicine

## 2019-03-24 ENCOUNTER — Ambulatory Visit
Admission: RE | Admit: 2019-03-24 | Discharge: 2019-03-24 | Disposition: A | Discharge status: Home / Self Care | Payer: Medicare Other | Source: Ambulatory Visit | Attending: Family Medicine | Admitting: Family Medicine

## 2019-03-24 ENCOUNTER — Other Ambulatory Visit: Payer: Self-pay

## 2019-03-24 DIAGNOSIS — Z1231 Encounter for screening mammogram for malignant neoplasm of breast: Secondary | ICD-10-CM

## 2019-11-02 ENCOUNTER — Ambulatory Visit: Payer: Medicare Other

## 2019-11-04 ENCOUNTER — Ambulatory Visit: Payer: Medicare PPO | Attending: Internal Medicine

## 2019-11-04 DIAGNOSIS — Z20822 Contact with and (suspected) exposure to covid-19: Secondary | ICD-10-CM

## 2019-11-05 LAB — NOVEL CORONAVIRUS, NAA: SARS-CoV-2, NAA: NOT DETECTED

## 2019-11-11 ENCOUNTER — Ambulatory Visit: Payer: Medicare PPO | Attending: Internal Medicine

## 2019-11-11 DIAGNOSIS — Z23 Encounter for immunization: Secondary | ICD-10-CM | POA: Insufficient documentation

## 2019-11-11 NOTE — Progress Notes (Signed)
   Covid-19 Vaccination Clinic  Name:  Chelsea Morales    MRN: [id number] DOB: 1940-12-06  11/11/2019  Chelsea Morales was observed post Covid-19 immunization for 15 minutes without incidence. She was provided with Vaccine Information Sheet and instruction to access the V-Safe system.   Chelsea Morales was instructed to call 911 with any severe reactions post vaccine: Marland Kitchen Difficulty breathing  . Swelling of your face and throat  . A fast heartbeat  . A bad rash all over your body  . Dizziness and weakness    Immunizations Administered    Name Date Dose VIS Date Route   Pfizer COVID-19 Vaccine 11/11/2019  2:18 PM 0.3 mL 09/17/2019 Intramuscular   Manufacturer: [employer], [employer]   Lot: [id number]   NDC: [address]-7

## 2019-11-23 ENCOUNTER — Ambulatory Visit: Payer: Medicare Other

## 2019-12-06 ENCOUNTER — Ambulatory Visit: Payer: Medicare PPO | Attending: Internal Medicine

## 2019-12-06 DIAGNOSIS — Z23 Encounter for immunization: Secondary | ICD-10-CM | POA: Insufficient documentation

## 2019-12-06 NOTE — Progress Notes (Signed)
   Covid-19 Vaccination Clinic  Name:  Chelsea Morales    MRN: [id number] DOB: December 05, 1940  12/06/2019  [name] was observed post Covid-19 immunization for 15 minutes without incidence. She was provided with Vaccine Information Sheet and instruction to access the V-Safe system.   [name] was instructed to call 911 with any severe reactions post vaccine: Marland Kitchen Difficulty breathing  . Swelling of your face and throat  . A fast heartbeat  . A bad rash all over your body  . Dizziness and weakness    Immunizations Administered    Name Date Dose VIS Date Route   Pfizer COVID-19 Vaccine 12/06/2019  3:55 PM 0.3 mL 09/17/2019 Intramuscular   Manufacturer: [employer], [employer]   Lot: [id number]   NDC: [address]-9

## 2020-03-24 ENCOUNTER — Other Ambulatory Visit: Payer: Self-pay | Admitting: Family Medicine

## 2020-03-24 DIAGNOSIS — Z1231 Encounter for screening mammogram for malignant neoplasm of breast: Secondary | ICD-10-CM

## 2020-03-30 ENCOUNTER — Ambulatory Visit
Admission: RE | Admit: 2020-03-30 | Discharge: 2020-03-30 | Disposition: A | Discharge status: Home / Self Care | Payer: Medicare PPO | Source: Ambulatory Visit | Attending: Family Medicine | Admitting: Family Medicine

## 2020-03-30 ENCOUNTER — Other Ambulatory Visit: Payer: Self-pay

## 2020-03-30 DIAGNOSIS — Z1231 Encounter for screening mammogram for malignant neoplasm of breast: Secondary | ICD-10-CM

## 2020-06-06 ENCOUNTER — Ambulatory Visit: Payer: Medicare PPO | Admitting: Internal Medicine

## 2020-07-11 ENCOUNTER — Ambulatory Visit: Payer: Medicare PPO | Attending: Internal Medicine

## 2020-07-11 DIAGNOSIS — Z23 Encounter for immunization: Secondary | ICD-10-CM

## 2020-07-11 NOTE — Progress Notes (Signed)
   Covid-19 Vaccination Clinic  Name:  Chelsea Morales    MRN: [id number] DOB: December 19, 1940  07/11/2020  Chelsea Morales was observed post Covid-19 immunization for 15 minutes without incident. She was provided with Vaccine Information Sheet and instruction to access the V-Safe system.   Chelsea Morales was instructed to call 911 with any severe reactions post vaccine: Marland Kitchen Difficulty breathing  . Swelling of face and throat  . A fast heartbeat  . A bad rash all over body  . Dizziness and weakness

## 2020-07-24 DIAGNOSIS — H6123 Impacted cerumen, bilateral: Secondary | ICD-10-CM | POA: Diagnosis not present

## 2020-08-01 ENCOUNTER — Other Ambulatory Visit: Payer: Self-pay | Admitting: Internal Medicine

## 2020-08-01 DIAGNOSIS — J479 Bronchiectasis, uncomplicated: Secondary | ICD-10-CM

## 2020-08-02 ENCOUNTER — Encounter: Payer: Self-pay | Admitting: Internal Medicine

## 2020-08-02 ENCOUNTER — Other Ambulatory Visit: Payer: Self-pay

## 2020-08-02 ENCOUNTER — Ambulatory Visit (INDEPENDENT_AMBULATORY_CARE_PROVIDER_SITE_OTHER): Payer: Medicare PPO | Admitting: Internal Medicine

## 2020-08-02 VITALS — BP 110/68 | HR 66 | Temp 97.3°F | Ht 65.0 in | Wt 117.0 lb

## 2020-08-02 DIAGNOSIS — H6120 Impacted cerumen, unspecified ear: Secondary | ICD-10-CM | POA: Diagnosis not present

## 2020-08-02 DIAGNOSIS — R196 Halitosis: Secondary | ICD-10-CM

## 2020-08-02 DIAGNOSIS — J479 Bronchiectasis, uncomplicated: Secondary | ICD-10-CM

## 2020-08-02 LAB — PULMONARY FUNCTION TEST
DL/VA % pred: 90 %
DL/VA: 3.66 ml/min/mmHg/L
DLCO cor % pred: 91 %
DLCO cor: 17.69 ml/min/mmHg
DLCO unc % pred: 91 %
DLCO unc: 17.69 ml/min/mmHg
FEF 25-75 Pre: 1.14 L/sec
FEF2575-%Pred-Pre: 80 %
FEV1-%Pred-Pre: 96 %
FEV1-Pre: 1.93 L
FEV1FVC-%Pred-Pre: 93 %
FEV6-%Pred-Pre: 109 %
FEV6-Pre: 2.78 L
FEV6FVC-%Pred-Pre: 104 %
FVC-%Pred-Pre: 104 %
FVC-Pre: 2.8 L
Pre FEV1/FVC ratio: 69 %
Pre FEV6/FVC Ratio: 99 %

## 2020-08-02 NOTE — Progress Notes (Signed)
Spirometry and Dlco done today. 

## 2020-08-02 NOTE — Progress Notes (Signed)
PI PCP Chelsea Found, MD   HPI   OV 12/14/2015  Chief Complaint  Patient presents with  . Pulmonary Consult    Pt changing providers from MW to MR. Pt states her breathing has not changed since last OV in 07/2015 with MW. Pt c/o prod cough with yellow mucus. Pt denies SOB and CP/tightness.     79 year old female. Transfer of care from Dr. Sherene Morales to myself Dr. Marchelle Morales for bronchiectasis.   from [place] but has lived in [place] for 40 years. Her daughter moved to [facility] and in 2014 she visited her and suffered pneumonia. Prior to that she had no respiratory problems. Following recovery from the pneumonia she's had chronic cough which still persists without change. The cough is better when she is sitting erect but when she lies down she has several episodes and brings out at least 3-5 tissue full of yellow sputum. In the last 3 years she's had 3 course of antibiotics/prednisone. This as best that she can recollect. There are no new admissions. She has lost some weight recently but this is intentional following walking. She does not get dyspneic or wheeze at any time including during walking. Overall symptom burden is mild but she finds the cough and sputum disgusting and she wants to understand why.  May 2016 CT scan of the chest personally visualized shows right middle lobe and lingular bronchiectasis with some bronchiectasis in the left lower lobe.  Pulmonary function test to determine to be normal per chart review.   OV 02/29/2016  Chief Complaint  Patient presents with  . Follow-up    Review Bronch results. denies any current breathing issues today.     Follow-up right middle lobe and lingular bronchiectasis. She underwent bronchoscopy with lavage 02/07/2016. Results showed Pseudomonas fluorescence. She is now on day 13 out of 14 of high-dose ciprofloxacin. With this the cough and sputum production that her chronic have resolved. She feels she's  having some dry mouth/oral sores with the ciprofloxacin. Otherwise feels good. We discussed the results.  some concerns about blood pressure. But the blood pressure was checked with her shirt on. We rechecked it and the systolic was in the 100s and diastolic 60s     OV 09/05/2016  Chief Complaint  Patient presents with  . Follow-up    90mo rov. pt states breathing is doing well. pt c/o mild prod cough with white to yellow mucus. pt denies any sob, wheezing or chest dicomfort.    Follow-up right middle lobe and lingular bronchiectasis. She underwent bronchoscopy with lavage 02/07/2016. Results showed Pseudomonas fluorescence  She is following up after May 2017. In the interim she's only had very mild cough. Cough is rated at level II out of 10. It is mostly at night. The sputum is clear and thin. She uses the flutter valve only occasionally when he does help. She does not use any inhalers. She does not feel like she needs any inhalers for her level of symptomatology. She has no dyspnea or chest tightness or wheezing. She is not interested in inhaler therapy. She is open to trying Mucinex. She will have a flu shot today.   OV 06/05/2017  Chief Complaint  Patient presents with  . Follow-up    Pt has presistant productive cough with yellow thick mucus each night for last few months. Pt is having trouble getting to sleep.    Follow-up right middle lobe and lingular bronchiectasis. Status post bronchoscopy with lavage May 2017  with growth of Pseudomonas fluorescence. Normal lung function in October 2016. She is only on as needed symptomatically therapy but mostly on observation only therapy    Last visit November 2017. After that she's been doing well. For the last 3 or 4 months she's had cough and mucus at night that is slightly increased compared to baseline. She says that she coughs it out mucus is clear but at the end it becomes yellow. It is only small in amount. But it is a definite change  for the worse. Because of this she has some difficulty initiating sleep but then after that she sleeps well. Denies any dyspnea or chest tightness and wheezing or any daytime problems. She is open to trying Mucinex or taking a steam inhalation. But beyond that she does not want to do inhaler therapy or any other medications. She does not feel flareup.    OV 07/27/2018  Subjective:  Patient ID: Chelsea Morales, female , DOB: April 06, 1941 , age 70 y.o. , MRN: [id number] , ADDRESS: [address] [place] [address]  Follow-up right middle lobe and lingular bronchiectasis. Status post bronchoscopy with lavage May 2017 with growth of Pseudomonas fluorescence. Normal lung function in October 2016. She is only on as needed symptomatically therapy but mostly on observation only therapy   07/27/2018 -   Chief Complaint  Patient presents with  . Follow-up    doing well at this time no problems.     HPI Chelsea Morales 79 y.o. -returns for bronchiectasis.  She prefers to just have supportive care and expectant follow-up.  Last seen over a year ago.  She says that 3 weeks ago she was in [place] and 3 weeks ago [place] was [place] the [place] when she got a sore throat and then started having what sounds like a classic bronchiectasis flare with significant amount of yellow sputum and cough and congestion and wheezing and shortness of breath and chest tightness.  This made her more dyspneic while climbing mountains.  Nevertheless she persisted.  Her symptoms have spontaneously resolved 1 week ago.  For the last 2 days she is now returned to [facility] and is currently jet lag but feeling great.  She feels the sputum production is even better than baseline which is hardly any sputum.  Currently no cough or shortness of breath or wheezing.  She feels great.  She does not feel the need to take daily maintenance inhalers.  She is not taking Mucinex or doing saline nebulizer and a hypertonic format.   She wants to have a flu shot today.    ROS - per HPI   OV 08/02/2020  Subjective:  Patient ID: Chelsea Morales, female , DOB: March 16, 1941 , age 73 y.o. , MRN: [id number] , ADDRESS: [address] [place] [address] PCP Merri Brunette, MD Patient Care Team: Merri Brunette, MD as PCP - General (Family Medicine)  This Provider for this visit: Treatment Team:  Attending Provider: Kalman Shan, MD    08/02/2020 -   Chief Complaint  Patient presents with  . Follow-up    no complaints, PFT follow up     Follow-up right middle lobe and lingular bronchiectasis. Status post bronchoscopy with lavage May 2017 with growth of Pseudomonas fluorescence. Normal lung function in October 2016. She is only on as needed symptomatically therapy but mostly on observation only therapy. LAst CT May 2016.  Last chest x-ray October 2019     HPI [facility] Doyne Keel  79 y.o. -presents for annual follow-up but she missed October 2020 because of the COVID-19 pandemic.  She continues to do well.  She has not traveled.  She is social distancing and isolating.  Has not had any exacerbations of bronchiectasis.  She does complain of mild mucus every morning.  She also complains of halitosis ever since masking.  She also has dealt with vertigo and she had earwax cleaned and it went away.  But now she feels it is coming back.  She asked me to check a years and this earwax again.  She is up-to-date with her vaccines.  Last pulmonary function test was today it shows decline in lung function but still adequate.  This 40 cc/year of FEV1 declined which is slightly more than average but still current overall severity is mild/normal.       CAT COPD Symptom & Quality of Life Score (GSK trademark) -being used for bronchiectasis 0 is no burden. 5 is highest burden  July 27, 2018 - x last 2 days. This is baseline  Never Cough -> Cough all the time 2  No phlegm in chest -> Chest is full of phlegm 2   No chest tightness -> Chest feels very tight 0  No dyspnea for 1 flight stairs/hill -> Very dyspneic for 1 flight of stairs 1  No limitations for ADL at home -> Very limited with ADL at home 0  Confident leaving home -> Not at all confident leaving home 0  Sleep soundly -> Do not sleep soundly because of lung condition 1  Lots of Energy -> No energy at all 2  TOTAL Score (max 40)  8     ROS - per HPI  PFT Results Latest Ref Rng & Units 08/02/2020 07/28/2015  FVC-Pre L 2.80 2.94  FVC-Predicted Pre % 104 101  FVC-Post L - 2.77  FVC-Predicted Post % - 95  Pre FEV1/FVC % % 69 73  Post FEV1/FCV % % - 77  FEV1-Pre L 1.93 2.14  FEV1-Predicted Pre % 96 99  FEV1-Post L - 2.14  DLCO uncorrected ml/min/mmHg 17.69 -  DLCO UNC% % 91 -  DLCO corrected ml/min/mmHg 17.69 -  DLCO COR %Predicted % 91 -  DLVA Predicted % 90 -  TLC L - 5.33  TLC % Predicted % - 102  RV % Predicted % - 101       has a past medical history of Bronchiectasis (HCC), Hematuria, Kidney stones, and Ruptured ear drum.   reports that she quit smoking about 60 years ago. Her smoking use included cigarettes. She has a 0.25 pack-year smoking history. She has never used smokeless tobacco.  Past Surgical History:  Procedure Laterality Date  . BREAST BIOPSY    . OOPHORECTOMY    . VIDEO BRONCHOSCOPY Bilateral 02/07/2016   Procedure: VIDEO BRONCHOSCOPY WITHOUT FLUORO;  Surgeon: Kalman ShanMurali Makaylyn Sinyard, MD;  Location: [facility];  Service: Cardiopulmonary;  Laterality: Bilateral;    Allergies  Allergen Reactions  . Sulfa Antibiotics Anaphylaxis    Immunization History  Administered Date(s) Administered  . Influenza, High Dose Seasonal PF 09/05/2016, 06/05/2017, 07/27/2018  . Influenza,inj,Quad PF,6+ Mos 06/24/2014, 07/28/2015  . PFIZER SARS-COV-2 Vaccination 11/11/2019, 12/06/2019, 07/11/2020  . Pneumococcal Conjugate-13 06/24/2014  . Pneumococcal Polysaccharide-23 07/27/2018    Family History  Problem Relation  Age of Onset  . Heart failure Brother   . Breast cancer Mother   . Colon cancer Mother   . Heart attack Father   . Breast cancer Maternal Grandmother  Current Outpatient Medications:  .  calcium-vitamin D 250-100 MG-UNIT tablet, Take 2 tablets by mouth 2 (two) times daily., Disp: , Rfl:  .  magnesium 30 MG tablet, Take 30 mg by mouth 2 (two) times daily., Disp: , Rfl:  .  Respiratory Therapy Supplies (FLUTTER) DEVI, Use as directed, Disp: 1 each, Rfl: 0 .  traZODone (DESYREL) 50 MG tablet, , Disp: , Rfl:       Objective:   Vitals:   08/02/20 1208  BP: 110/68  Pulse: 66  Temp: (!) 97.3 F (36.3 C)  TempSrc: Temporal  SpO2: 99%  Weight: 117 lb (53.1 kg)  Height: 5\' 5"  (1.651 m)    Estimated body mass index is 19.47 kg/m as calculated from the following:   Height as of this encounter: 5\' 5"  (1.651 m).   Weight as of this encounter: 117 lb (53.1 kg).  @WEIGHTCHANGE @    08/02/20 1208  Weight: 117 lb (53.1 kg)     Physical Exam   General: No distress. Looks well Neuro: Alert and Oriented x 3. GCS 15. Speech normal Psych: Pleasant Resp:  Barrel Chest - no.  Wheeze - no, Crackles - no, No overt respiratory distress CVS: Normal heart sounds. Murmurs - no Ext: Stigmata of Connective Tissue Disease - no HEENT: Normal upper airway. PEERL +. No post nasal drip        Assessment:       ICD-10-CM   1. Bronchiectasis without complication (HCC)  J47.9   2. Halitosis  R19.6   3. Wax in ear  H61.20        Plan:     Patient Instructions     ICD-10-CM   1. Bronchiectasis without complication (HCC)  J47.9   2. Halitosis  R19.6   3. Wax in ear  H61.20     You have an average of 40 cc decline in lung function per year in the last 5 years.  This is a little bit more than normal average which is around 30 cc/year of lung function loss.  Nevertheless he still within normal limits/mild severity.  The best way to manage her lung function is to  ensure good mucus clearance.  We discussed 3% saline nebulizer versus doing flutter valve on a regular basis.  Schedule for flutter valve  Plan  -Do flutter valve 5 times each time and at least 5-10 times daily -For any future travel let consider you taking a maintenance inhaler to prevent flareups -Also for any travel let [employer] know and we will give you a handy prescription of an antibiotic and prednisone to be used in similar situation  Take  -Encourage you eat nonfat Greek plain yogurt daily -Drink a lot of water -Continue you usual regular dental hygiene which are excellent at  Earwax  -Present on both ears mildly.  Plan -Per primary care physician  Follow-up -I return in 1 year; COPD CAT score at follow-up     SIGNATURE    Dr. 08/04/20, M.D., F.C.C.P,  Pulmonary and Critical Care Medicine Staff Physician, [facility] Center Director - Interstitial Lung Disease  Program  Pulmonary Fibrosis [facility] at [facility] [place], [facility], [facility]  Pager: [phone], If no answer or between  15:00h - 7:00h: call [phone] Telephone: [phone]  12:51 PM 08/02/2020

## 2020-08-02 NOTE — Patient Instructions (Addendum)
ICD-10-CM   1. Bronchiectasis without complication (HCC)  J47.9   2. Halitosis  R19.6   3. Wax in ear  H61.20     You have an average of 40 cc decline in lung function per year in the last 5 years.  This is a little bit more than normal average which is around 30 cc/year of lung function loss.  Nevertheless he still within normal limits/mild severity.  The best way to manage her lung function is to ensure good mucus clearance.  We discussed 3% saline nebulizer versus doing flutter valve on a regular basis.  Schedule for flutter valve  Plan  -Do flutter valve 5 times each time and at least 5-10 times daily -For any future travel let [place] consider you taking a maintenance inhaler to prevent flareups -Also for any travel let [place] know and we will give you a handy prescription of an antibiotic and prednisone to be used in similar situation  Take  -Encourage you eat nonfat Greek plain yogurt daily -Drink a lot of water -Continue you usual regular dental hygiene which are excellent at  Earwax  -Present on both ears mildly.  Plan -Per primary care physician  Follow-up -I return in 1 year; COPD CAT score at follow-up

## 2020-09-19 DIAGNOSIS — H5213 Myopia, bilateral: Secondary | ICD-10-CM | POA: Diagnosis not present

## 2020-09-19 DIAGNOSIS — Z961 Presence of intraocular lens: Secondary | ICD-10-CM | POA: Diagnosis not present

## 2020-09-19 DIAGNOSIS — H524 Presbyopia: Secondary | ICD-10-CM | POA: Diagnosis not present

## 2020-09-19 DIAGNOSIS — H10413 Chronic giant papillary conjunctivitis, bilateral: Secondary | ICD-10-CM | POA: Diagnosis not present

## 2020-12-28 DIAGNOSIS — H00015 Hordeolum externum left lower eyelid: Secondary | ICD-10-CM | POA: Diagnosis not present

## 2021-02-21 ENCOUNTER — Other Ambulatory Visit: Payer: Self-pay | Admitting: Family Medicine

## 2021-02-21 DIAGNOSIS — Z1231 Encounter for screening mammogram for malignant neoplasm of breast: Secondary | ICD-10-CM

## 2021-03-15 DIAGNOSIS — Z1211 Encounter for screening for malignant neoplasm of colon: Secondary | ICD-10-CM | POA: Diagnosis not present

## 2021-03-22 ENCOUNTER — Other Ambulatory Visit: Payer: Self-pay | Admitting: Family Medicine

## 2021-03-22 DIAGNOSIS — M8580 Other specified disorders of bone density and structure, unspecified site: Secondary | ICD-10-CM

## 2021-03-22 DIAGNOSIS — E2839 Other primary ovarian failure: Secondary | ICD-10-CM

## 2021-03-30 ENCOUNTER — Other Ambulatory Visit: Payer: Self-pay

## 2021-03-30 ENCOUNTER — Ambulatory Visit
Admission: RE | Admit: 2021-03-30 | Discharge: 2021-03-30 | Disposition: A | Discharge status: Home / Self Care | Payer: Medicare PPO | Source: Ambulatory Visit | Attending: Family Medicine | Admitting: Family Medicine

## 2021-03-30 DIAGNOSIS — M810 Age-related osteoporosis without current pathological fracture: Secondary | ICD-10-CM | POA: Diagnosis not present

## 2021-03-30 DIAGNOSIS — M8580 Other specified disorders of bone density and structure, unspecified site: Secondary | ICD-10-CM

## 2021-03-30 DIAGNOSIS — E2839 Other primary ovarian failure: Secondary | ICD-10-CM

## 2021-03-30 DIAGNOSIS — Z780 Asymptomatic menopausal state: Secondary | ICD-10-CM | POA: Diagnosis not present

## 2021-03-30 DIAGNOSIS — M8588 Other specified disorders of bone density and structure, other site: Secondary | ICD-10-CM | POA: Diagnosis not present

## 2021-04-17 DIAGNOSIS — Z20822 Contact with and (suspected) exposure to covid-19: Secondary | ICD-10-CM | POA: Diagnosis not present

## 2021-05-08 ENCOUNTER — Ambulatory Visit
Admission: RE | Admit: 2021-05-08 | Discharge: 2021-05-08 | Disposition: A | Discharge status: Home / Self Care | Payer: Medicare PPO | Source: Ambulatory Visit | Attending: Family Medicine | Admitting: Family Medicine

## 2021-05-08 ENCOUNTER — Other Ambulatory Visit: Payer: Self-pay

## 2021-05-08 DIAGNOSIS — Z1231 Encounter for screening mammogram for malignant neoplasm of breast: Secondary | ICD-10-CM | POA: Diagnosis not present

## 2021-05-28 DIAGNOSIS — J479 Bronchiectasis, uncomplicated: Secondary | ICD-10-CM | POA: Diagnosis not present

## 2021-05-28 DIAGNOSIS — E7800 Pure hypercholesterolemia, unspecified: Secondary | ICD-10-CM | POA: Diagnosis not present

## 2021-05-28 DIAGNOSIS — Z0000 Encounter for general adult medical examination without abnormal findings: Secondary | ICD-10-CM | POA: Diagnosis not present

## 2021-05-28 DIAGNOSIS — Z1389 Encounter for screening for other disorder: Secondary | ICD-10-CM | POA: Diagnosis not present

## 2021-05-28 DIAGNOSIS — M810 Age-related osteoporosis without current pathological fracture: Secondary | ICD-10-CM | POA: Diagnosis not present

## 2021-05-28 DIAGNOSIS — F5101 Primary insomnia: Secondary | ICD-10-CM | POA: Diagnosis not present

## 2021-08-07 DIAGNOSIS — H6123 Impacted cerumen, bilateral: Secondary | ICD-10-CM | POA: Diagnosis not present

## 2021-11-06 DIAGNOSIS — Z961 Presence of intraocular lens: Secondary | ICD-10-CM | POA: Diagnosis not present

## 2021-11-06 DIAGNOSIS — H10413 Chronic giant papillary conjunctivitis, bilateral: Secondary | ICD-10-CM | POA: Diagnosis not present

## 2021-11-06 DIAGNOSIS — H52203 Unspecified astigmatism, bilateral: Secondary | ICD-10-CM | POA: Diagnosis not present

## 2021-11-06 DIAGNOSIS — H43811 Vitreous degeneration, right eye: Secondary | ICD-10-CM | POA: Diagnosis not present

## 2022-03-26 ENCOUNTER — Ambulatory Visit (INDEPENDENT_AMBULATORY_CARE_PROVIDER_SITE_OTHER): Payer: Medicare PPO

## 2022-03-26 ENCOUNTER — Encounter: Payer: Self-pay | Admitting: Internal Medicine

## 2022-03-26 ENCOUNTER — Ambulatory Visit: Payer: Medicare PPO | Admitting: Internal Medicine

## 2022-03-26 VITALS — BP 120/58 | HR 71 | Temp 98.3°F | Ht 65.0 in | Wt 113.0 lb

## 2022-03-26 DIAGNOSIS — R0981 Nasal congestion: Secondary | ICD-10-CM | POA: Diagnosis not present

## 2022-03-26 DIAGNOSIS — J479 Bronchiectasis, uncomplicated: Secondary | ICD-10-CM

## 2022-03-26 DIAGNOSIS — R634 Abnormal weight loss: Secondary | ICD-10-CM

## 2022-03-26 NOTE — Patient Instructions (Addendum)
ICD-10-CM   1. Bronchiectasis without complication (HCC)  J47.9     2. Nasal congestion  R09.81     3. Weight loss, non-intentional  R63.4        Bronchiectasis is clinically stable but having symptoms of mucus at night.  Currently not taking any treatment  Daily nasal congestion  4 pound weight loss in 3 years to current weight of 113 pounds  Plan  -Get chest x-ray two-view -we will let you know the results -Get diligent about do flutter valve 5 times each time and at least 5-10 times daily -Get over-the-counter saline nasal spray and do 2 squirts into each nostril twice daily -Flu shot and RSV vaccine in the fall -Do spirometry and DLCO with and without bronchodilator in 6 months  Follow-up -6 months for routine follow-up but after breathing test 15-minute slot

## 2022-03-26 NOTE — Progress Notes (Signed)
PI PCP [name], MD   HPI   OV 12/14/2015  Chief Complaint  Patient presents with   Pulmonary Consult    Pt changing providers from MW to MR. Pt states her breathing has not changed since last OV in 07/2015 with MW. Pt c/o prod cough with yellow mucus. Pt denies SOB and CP/tightness.     81 year old female. Transfer of care from Dr. Sherene Sires to myself Dr. Marchelle Gearing for bronchiectasis.   from [place] but has lived in [place] for 40 years. Her daughter moved to [facility] [place] and in 2014 she visited her and suffered pneumonia. Prior to that she had no respiratory problems. Following recovery from the pneumonia she's had chronic cough which still persists without change. The cough is better when she is sitting erect but when she lies down she has several episodes and brings out at least 3-5 tissue full of yellow sputum. In the last 3 years she's had 3 course of antibiotics/prednisone. This as best that she can recollect. There are no new admissions. She has lost some weight recently but this is intentional following walking. She does not get dyspneic or wheeze at any time including during walking. Overall symptom burden is mild but she finds the cough and sputum disgusting and she wants to understand why.  May 2016 CT scan of the chest personally visualized shows right middle lobe and lingular bronchiectasis with some bronchiectasis in the left lower lobe.  Pulmonary function test to determine to be normal per chart review.   OV 02/29/2016  Chief Complaint  Patient presents with   Follow-up    Review Bronch results. denies any current breathing issues today.     Follow-up right middle lobe and lingular bronchiectasis. She underwent bronchoscopy with lavage 02/07/2016. Results showed Pseudomonas fluorescence. She is now on day 13 out of 14 of high-dose ciprofloxacin. With this the cough and sputum production that her chronic have resolved. She feels she's having  some dry mouth/oral sores with the ciprofloxacin. Otherwise feels good. We discussed the results.  some concerns about blood pressure. But the blood pressure was checked with her shirt on. We rechecked it and the systolic was in the 100s and diastolic 60s     OV 09/05/2016  Chief Complaint  Patient presents with   Follow-up    60mo rov. pt states breathing is doing well. pt c/o mild prod cough with white to yellow mucus. pt denies any sob, wheezing or chest dicomfort.    Follow-up right middle lobe and lingular bronchiectasis. She underwent bronchoscopy with lavage 02/07/2016. Results showed Pseudomonas fluorescence  She is following up after May 2017. In the interim she's only had very mild cough. Cough is rated at level II out of 10. It is mostly at night. The sputum is clear and thin. She uses the flutter valve only occasionally when he does help. She does not use any inhalers. She does not feel like she needs any inhalers for her level of symptomatology. She has no dyspnea or chest tightness or wheezing. She is not interested in inhaler therapy. She is open to trying Mucinex. She will have a flu shot today.   OV 06/05/2017  Chief Complaint  Patient presents with   Follow-up    Pt has presistant productive cough with yellow thick mucus each night for last few months. Pt is having trouble getting to sleep.    Follow-up right middle lobe and lingular bronchiectasis. Status post bronchoscopy with lavage May 2017 with  growth of Pseudomonas fluorescence. Normal lung function in October 2016. She is only on as needed symptomatically therapy but mostly on observation only therapy    Last visit November 2017. After that she's been doing well. For the last 3 or 4 months she's had cough and mucus at night that is slightly increased compared to baseline. She says that she coughs it out mucus is clear but at the end it becomes yellow. It is only small in amount. But it is a definite change for the  worse. Because of this she has some difficulty initiating sleep but then after that she sleeps well. Denies any dyspnea or chest tightness and wheezing or any daytime problems. She is open to trying Mucinex or taking a steam inhalation. But beyond that she does not want to do inhaler therapy or any other medications. She does not feel flareup.    OV 07/27/2018  Subjective:  Patient ID: Chelsea Morales, female , DOB: 02/04/1941 , age 81 y.o. , MRN: [id number] , ADDRESS: [address] [place] [address]  Follow-up right middle lobe and lingular bronchiectasis. Status post bronchoscopy with lavage May 2017 with growth of Pseudomonas fluorescence. Normal lung function in October 2016. She is only on as needed symptomatically therapy but mostly on observation only therapy   07/27/2018 -   Chief Complaint  Patient presents with   Follow-up    doing well at this time no problems.     HPI Chelsea SmolderBonnie G Mccubbins 81 y.o. -returns for bronchiectasis.  She prefers to just have supportive care and expectant follow-up.  Last seen over a year ago.  She says [place] when she got a sore throat and then started having what sounds like a classic bronchiectasis flare with significant amount of yellow sputum and cough and congestion and wheezing and shortness of breath and chest tightness.  This made her more dyspneic while climbing mountains.  Nevertheless she persisted.  Her symptoms have spontaneously resolved 1 week ago.  For the last 2 days [place] and is currently jet lag but feeling great.  She feels the sputum production is even better than baseline which is hardly any sputum.  Currently no cough or shortness of breath or wheezing.  She feels great.  She does not feel the need to take daily maintenance inhalers.  She is not taking Mucinex or doing saline nebulizer and a hypertonic format.  She  wants to have a flu shot today.    ROS - per HPI   OV 08/02/2020  Subjective:  Patient ID: Chelsea RenderBonnie Mellett Morales, female , DOB: 02/04/1941 , age 81 y.o. , MRN: [id number] , ADDRESS: [address] [place] [address] PCP Merri BrunetteSmith, Candace, MD Patient Care Team: Merri BrunetteSmith, Candace, MD as PCP - General (Family Medicine)  This Provider for this visit: Treatment Team:  Attending Provider: Kalman Shanamaswamy, Ryah Cribb, MD    08/02/2020 -   Chief Complaint  Patient presents with   Follow-up    no complaints, PFT follow up     Follow-up right middle lobe and lingular bronchiectasis. Status post bronchoscopy with lavage May 2017 with growth of Pseudomonas fluorescence. Normal lung function in October 2016. She is only on as needed symptomatically therapy but mostly on observation only therapy. LAst CT May 2016.  Last chest x-ray October 2019     [facility] [address]81  y.o. -presents for annual follow-up but she missed October 2020 because of the COVID-19 pandemic.  She continues to do well.  She has not traveled.  She is social distancing and isolating.  Has not had any exacerbations of bronchiectasis.  She does complain of mild mucus every morning.  She also complains of halitosis ever since masking.  She also has dealt with vertigo and she had earwax cleaned and it went away.  But now she feels it is coming back.  She asked me to check a years and this earwax again.  She is up-to-date with her vaccines.  Last pulmonary function test was today it shows decline in lung function but still adequate.  This 40 cc/year of FEV1 declined which is slightly more than average but still current overall severity is mild/normal.       OV 03/26/2022  Subjective:  Patient ID: Chelsea Morales, female , DOB: [birth year] , age 53 y.o. , MRN: [id number] , ADDRESS: [address] [place] [address] PCP Merri Brunette, MD Patient Care Team: Merri Brunette, MD as PCP - General (Family  Medicine)  This Provider for this visit: Treatment Team:  Attending Provider: Kalman Shan, MD    03/26/2022 -   Chief Complaint  Patient presents with   Follow-up    Pt states she has been doing okay since last visit and denies any complaints.    Follow-up right middle lobe and lingular bronchiectasis. Status post bronchoscopy with lavage May 2017 with growth of Pseudomonas fluorescence. Normal lung function in October 2016. She is only on as needed symptomatically therapy but mostly on observation only therapy. LAst CT May 2016.  Last chest x-ray October 2019  HPI [facility] 81 y.[facility]. -returns for follow-up after a span of nearly 2 years.  She says overall she is doing well.  A year ago she picked up respiratory infection which she presumes is RSV from being exposed to immigrant kids it was not COVID.  After that she had sputum production for quite a while but she is back at baseline now.  At baseline she got sputum production of white thick sputum that.  A couple of Kleenex tissues at night when she lies down occasionally will be yellow.  But no hemoptysis no other shortness of breath otherwise feeling good.  Last imaging was a few years ago.  She is not taking any Mucinex.  She has a flutter valve but not using it.  Not using nebulizers no vest usage.  She does complain about some nasal congestion at night and having to blow her nose and sometimes she will have some epistaxis.  She is not using any saline nasal spray for this.       No data to display            PFT     Latest Ref Rng & Units 08/02/2020   10:59 AM 07/28/2015    8:55 AM  PFT Results  FVC-Pre L 2.80  2.94   FVC-Predicted Pre % 104  101   FVC-Post L  2.77   FVC-Predicted Post %  95   Pre FEV1/FVC % % 69  73   Post FEV1/FCV % %  77   FEV1-Pre L 1.93  2.14   FEV1-Predicted Pre % 96  99   FEV1-Post L  2.14   DLCO uncorrected ml/min/mmHg 17.69    DLCO UNC% % 91    DLCO corrected ml/min/mmHg  17.69    DLCO COR %  Predicted % 91    DLVA Predicted % 90    TLC L  5.33   TLC % Predicted %  102   RV % Predicted %  101        has a past medical history of Bronchiectasis (HCC), Hematuria, Kidney stones, and Ruptured ear drum.   reports that she quit smoking about 62 years ago. Her smoking use included cigarettes. She has a 0.25 pack-year smoking history. She has never used smokeless tobacco.  Past Surgical History:  Procedure Laterality Date   BREAST BIOPSY Right 08/08/2006   OOPHORECTOMY     VIDEO BRONCHOSCOPY Bilateral 02/07/2016   Procedure: VIDEO BRONCHOSCOPY WITHOUT FLUORO;  Surgeon: Kalman Shan, MD;  Location: [facility];  Service: Cardiopulmonary;  Laterality: Bilateral;    Allergies  Allergen Reactions   Sulfa Antibiotics Anaphylaxis    Immunization History  Administered Date(s) Administered   Influenza, High Dose Seasonal PF 09/05/2016, 06/05/2017, 07/27/2018   Influenza,inj,Quad PF,6+ Mos 06/24/2014, 07/28/2015   PFIZER(Purple Top)SARS-COV-2 Vaccination 11/11/2019, 12/06/2019, 07/11/2020   Pneumococcal Conjugate-13 06/24/2014   Pneumococcal Polysaccharide-23 07/27/2018    Family History  Problem Relation Age of Onset   Breast cancer Mother    Colon cancer Mother    Heart attack Father    Heart failure Brother      Current Outpatient Medications:    calcium-vitamin D 250-100 MG-UNIT tablet, Take 2 tablets by mouth 2 (two) times daily., Disp: , Rfl:    magnesium 30 MG tablet, Take 30 mg by mouth 2 (two) times daily., Disp: , Rfl:    Respiratory Therapy Supplies (FLUTTER) DEVI, Use as directed, Disp: 1 each, Rfl: 0   traZODone (DESYREL) 50 MG tablet, , Disp: , Rfl:       Objective:   Vitals:   03/26/22 1444  BP: (!) 120/58  Pulse: 71  Temp: 98.3 F (36.8 C)  TempSrc: Oral  SpO2: 97%  Weight: 113 lb (51.3 kg)  Height: 5\' 5"  (1.651 m)    Estimated body mass index is 18.8 kg/m as calculated from the following:   Height as of this  encounter: 5\' 5"  (1.651 m).   Weight as of this encounter: 113 lb (51.3 kg).  @WEIGHTCHANGE @  Filed Weights   03/26/22 1444  Weight: 113 lb (51.3 kg)     Physical Exam    General: No distress. Look swell Neuro: Alert and Oriented x 3. GCS 15. Speech normal Psych: Pleasant Resp:  Barrel Chest - no.  Wheeze - no, Crackles - no, No overt respiratory distress CVS: Normal heart sounds. Murmurs - no Ext: Stigmata of Connective Tissue Disease - no HEENT: Normal upper airway. PEERL +. No post nasal drip        Assessment:       ICD-10-CM   1. Bronchiectasis without complication (HCC)  J47.9     2. Nasal congestion  R09.81     3. Weight loss, non-intentional  R63.4          Plan:     Patient Instructions     ICD-10-CM   1. Bronchiectasis without complication (HCC)  J47.9     2. Nasal congestion  R09.81     3. Weight loss, non-intentional  R63.4        Bronchiectasis is clinically stable but having symptoms of mucus at night.  Currently not taking any treatment  Daily nasal congestion  4 pound weight loss in 3 years to current weight of 113 pounds  Plan  -Get chest  x-ray two-view -we will let you know the results -Get diligent about do flutter valve 5 times each time and at least 5-10 times daily -Get over-the-counter saline nasal spray and do 2 squirts into each nostril twice daily -Flu shot and RSV vaccine in the fall -Do spirometry and DLCO with and without bronchodilator in 6 months  Follow-up -6 months for routine follow-up but after breathing test 15-minute slot    SIGNATURE    Dr. Kalman Shan, M.D., F.C.C.P,  Pulmonary and Critical Care Medicine Staff Physician, [facility] Director - Interstitial Lung Disease  Program  Pulmonary Fibrosis [facility] at [facility] [place], [place], [address]  Pager: [phone], If no answer or between  15:00h - 7:00h: call [phone] Telephone: [phone]  3:09 PM 03/26/2022

## 2022-03-27 NOTE — Progress Notes (Signed)
Cxr has hypeinfilaton but lungs are clear. C/w obstructive lung disease

## 2022-04-03 IMAGING — MG MM DIGITAL SCREENING BILAT W/ TOMO AND CAD
8 series · 9 of 24 positions shown · non-contrast
Comparison: Previous exam(s).

CLINICAL DATA: Screening.

EXAM:
DIGITAL SCREENING BILATERAL MAMMOGRAM WITH TOMOSYNTHESIS AND CAD
TECHNIQUE: Bilateral screening digital craniocaudal and mediolateral oblique
mammograms were obtained. Bilateral screening digital breast
tomosynthesis was performed. The images were evaluated with
computer-aided detection.

[L CC synth-2D]
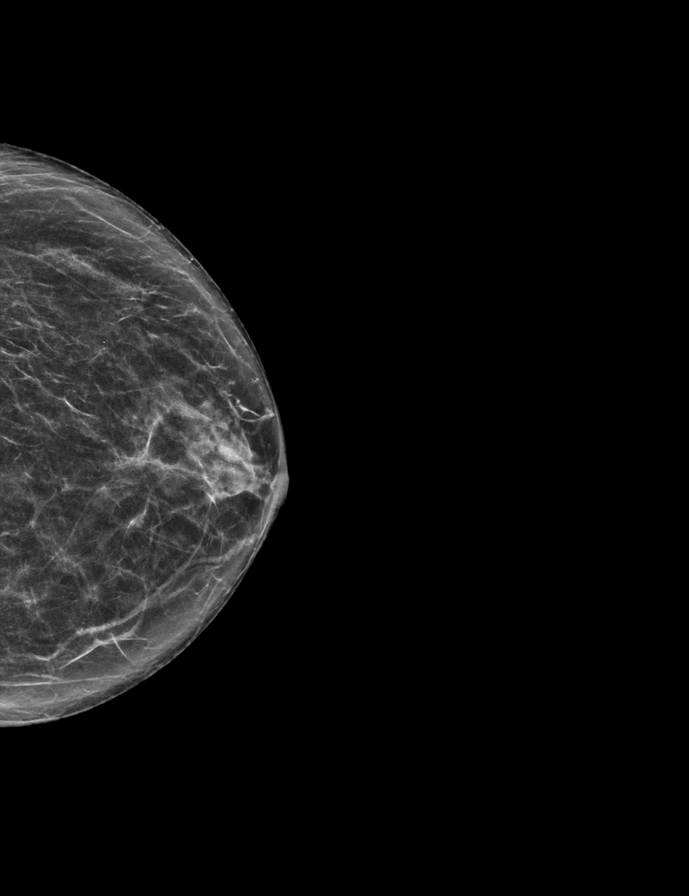

[R MLO synth-2D]
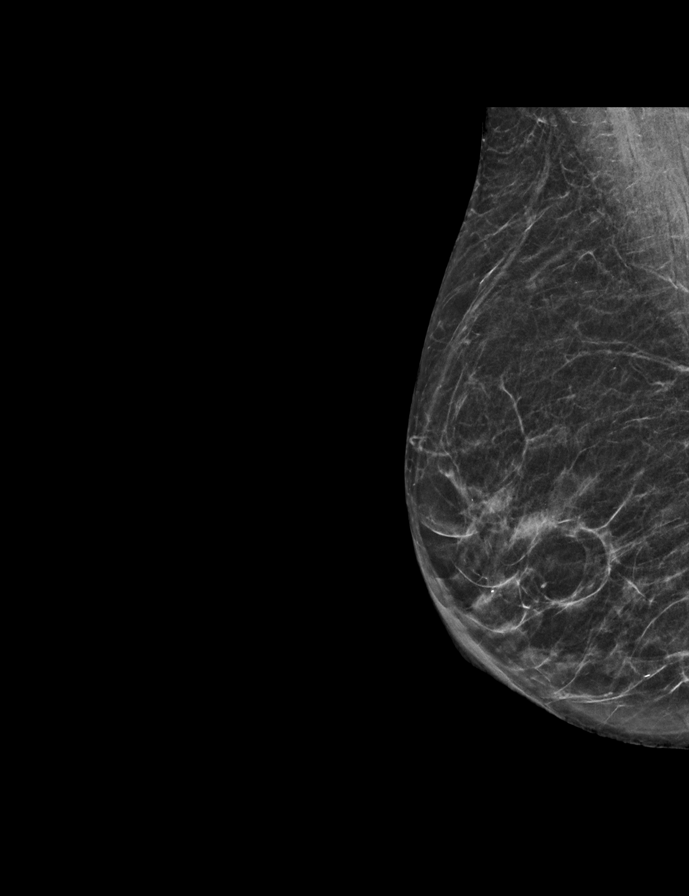

[R CC synth-2D]
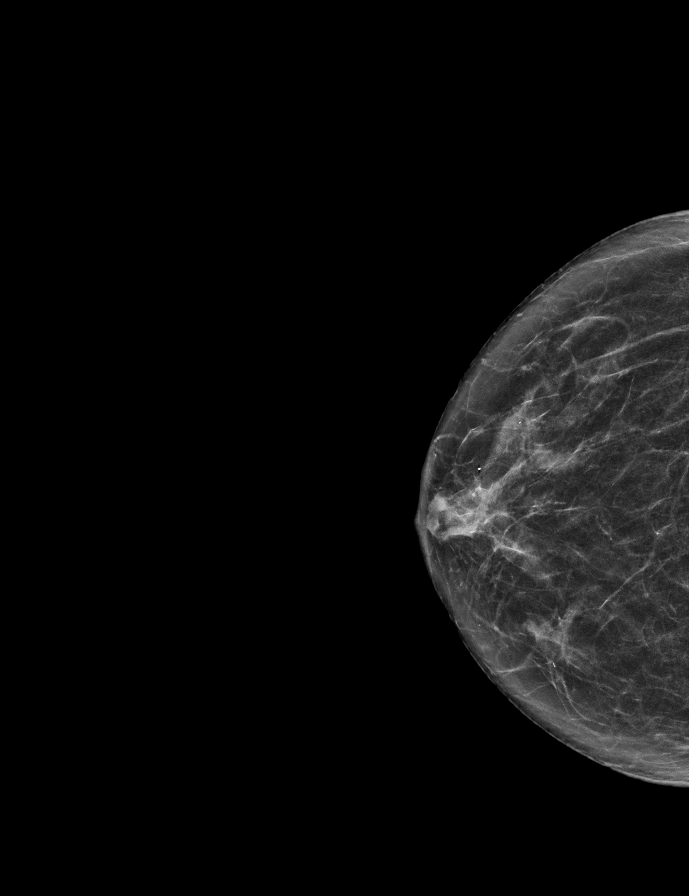

[L MLO synth-2D]
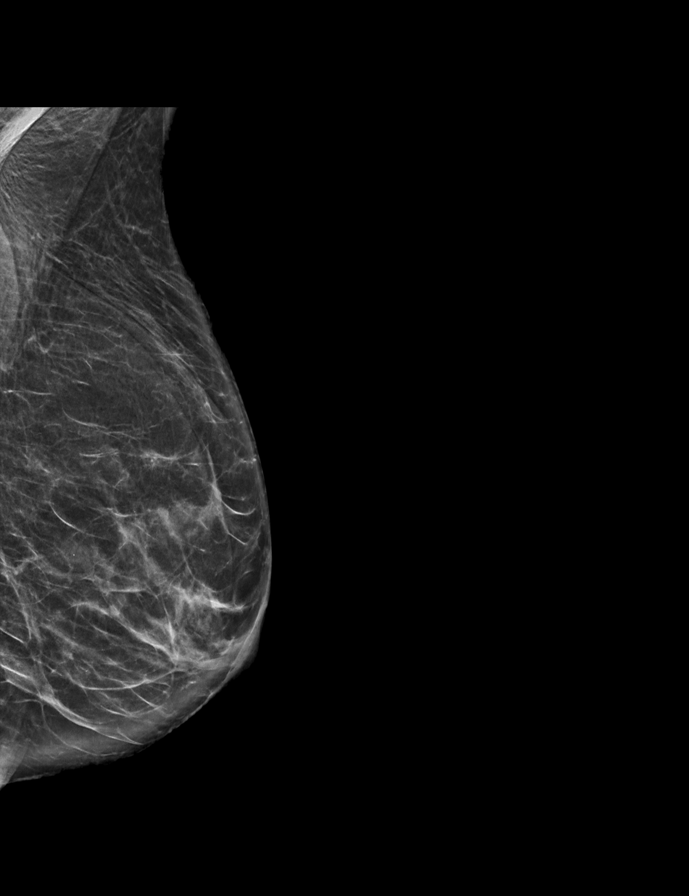

[R MLO tomo · 2 of 50 frames shown]
[frame 17/50]
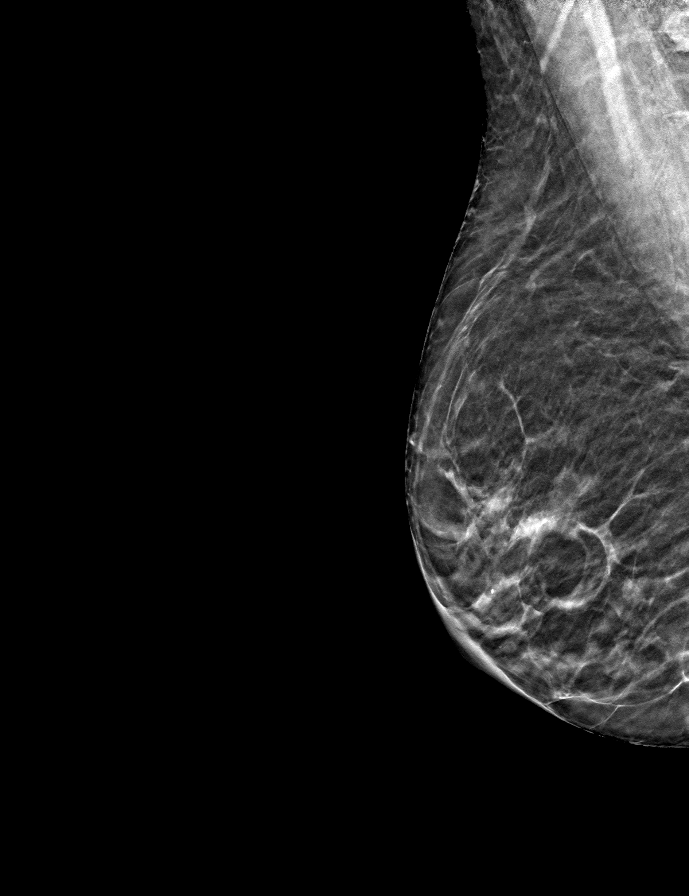
[frame 25/50]
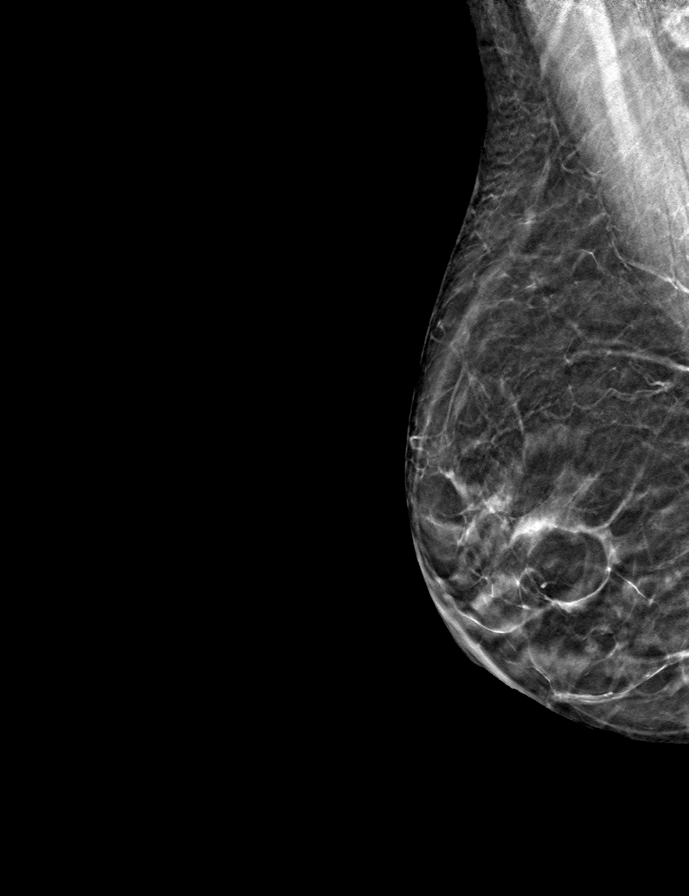

[R CC tomo · tomo slice 27/52.0]
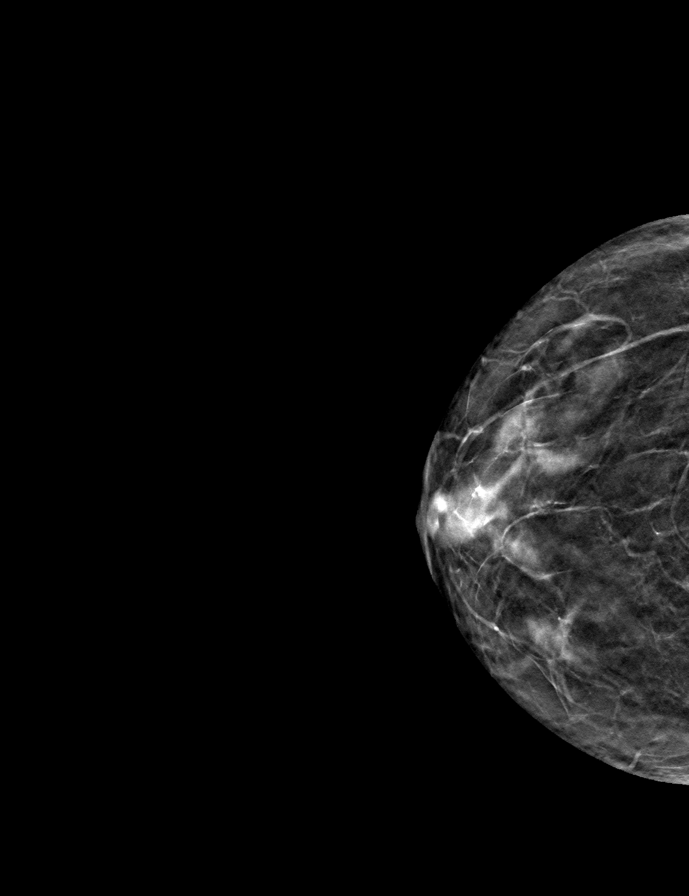

[L CC tomo · tomo slice 27/54.0]
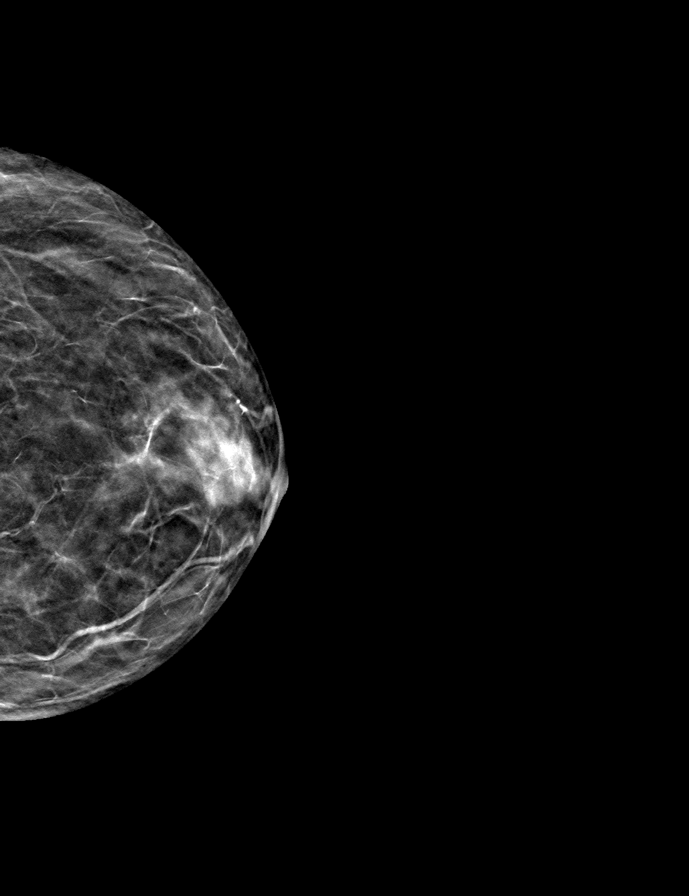

[L MLO tomo · tomo slice 31/62.0]
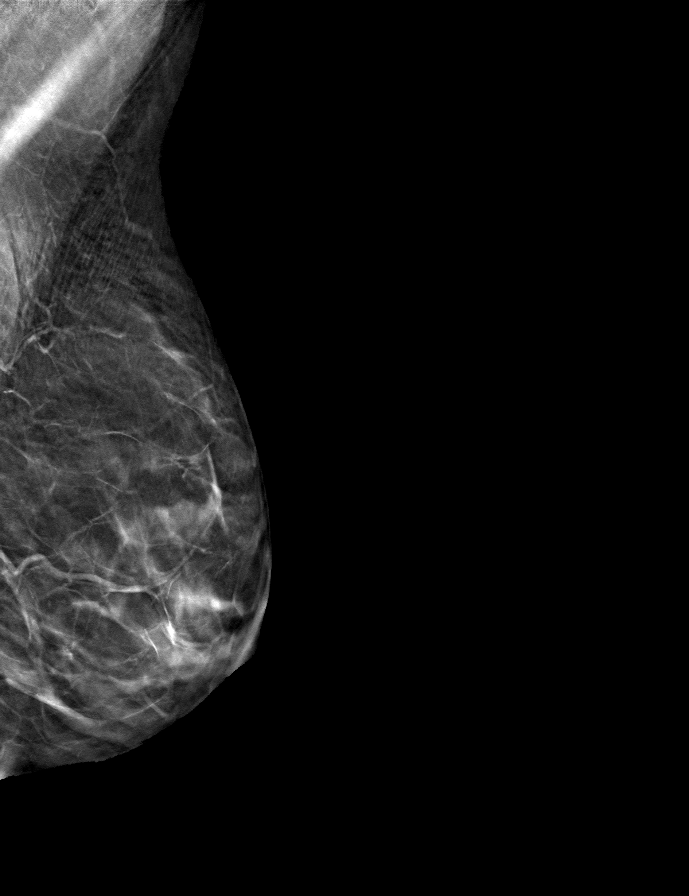

[9 of 24 positions shown; findings below may reference images not displayed]

ACR Breast Density Category b: There are scattered areas of
fibroglandular density.
FINDINGS: There are no findings suspicious for malignancy.
IMPRESSION: No mammographic evidence of malignancy. A result letter of this
screening mammogram will be mailed directly to the patient.

RECOMMENDATION:
Screening mammogram in one year. (Code:51-O-LD2)

BI-RADS CATEGORY  1: Negative.

## 2022-04-26 ENCOUNTER — Other Ambulatory Visit: Payer: Self-pay | Admitting: Family Medicine

## 2022-04-26 DIAGNOSIS — Z1231 Encounter for screening mammogram for malignant neoplasm of breast: Secondary | ICD-10-CM

## 2022-05-07 DIAGNOSIS — R55 Syncope and collapse: Secondary | ICD-10-CM

## 2022-05-07 HISTORY — DX: Syncope and collapse: R55

## 2022-05-09 ENCOUNTER — Ambulatory Visit
Admission: RE | Admit: 2022-05-09 | Discharge: 2022-05-09 | Disposition: A | Discharge status: Home / Self Care | Payer: Medicare PPO | Source: Ambulatory Visit | Attending: Family Medicine | Admitting: Family Medicine

## 2022-05-09 DIAGNOSIS — Z1231 Encounter for screening mammogram for malignant neoplasm of breast: Secondary | ICD-10-CM | POA: Diagnosis not present

## 2022-05-19 DIAGNOSIS — R509 Fever, unspecified: Secondary | ICD-10-CM | POA: Diagnosis not present

## 2022-05-19 DIAGNOSIS — R059 Cough, unspecified: Secondary | ICD-10-CM | POA: Diagnosis not present

## 2022-05-19 DIAGNOSIS — R5383 Other fatigue: Secondary | ICD-10-CM | POA: Diagnosis not present

## 2022-05-19 DIAGNOSIS — U071 COVID-19: Secondary | ICD-10-CM | POA: Diagnosis not present

## 2022-05-25 ENCOUNTER — Other Ambulatory Visit: Payer: Self-pay

## 2022-05-25 ENCOUNTER — Emergency Department (HOSPITAL_BASED_OUTPATIENT_CLINIC_OR_DEPARTMENT_OTHER)
Admission: EM | Admit: 2022-05-25 | Discharge: 2022-05-25 | Disposition: A | Discharge status: Home / Self Care | Payer: Medicare PPO | Attending: Emergency Medicine | Admitting: Emergency Medicine

## 2022-05-25 ENCOUNTER — Emergency Department (HOSPITAL_BASED_OUTPATIENT_CLINIC_OR_DEPARTMENT_OTHER): Payer: Medicare PPO

## 2022-05-25 DIAGNOSIS — Z8616 Personal history of COVID-19: Secondary | ICD-10-CM | POA: Diagnosis not present

## 2022-05-25 DIAGNOSIS — R55 Syncope and collapse: Secondary | ICD-10-CM

## 2022-05-25 DIAGNOSIS — S0083XA Contusion of other part of head, initial encounter: Secondary | ICD-10-CM | POA: Diagnosis not present

## 2022-05-25 DIAGNOSIS — Z043 Encounter for examination and observation following other accident: Secondary | ICD-10-CM | POA: Diagnosis not present

## 2022-05-25 DIAGNOSIS — S0990XA Unspecified injury of head, initial encounter: Secondary | ICD-10-CM | POA: Diagnosis not present

## 2022-05-25 DIAGNOSIS — W01198A Fall on same level from slipping, tripping and stumbling with subsequent striking against other object, initial encounter: Secondary | ICD-10-CM | POA: Diagnosis not present

## 2022-05-25 DIAGNOSIS — M542 Cervicalgia: Secondary | ICD-10-CM | POA: Insufficient documentation

## 2022-05-25 DIAGNOSIS — S0993XA Unspecified injury of face, initial encounter: Secondary | ICD-10-CM | POA: Diagnosis present

## 2022-05-25 DIAGNOSIS — S0012XA Contusion of left eyelid and periocular area, initial encounter: Secondary | ICD-10-CM | POA: Insufficient documentation

## 2022-05-25 LAB — BASIC METABOLIC PANEL
Anion gap: 12 (ref 5–15)
BUN: 25 mg/dL — ABNORMAL HIGH (ref 8–23)
CO2: 28 mmol/L (ref 22–32)
Calcium: 9.7 mg/dL (ref 8.9–10.3)
Chloride: 98 mmol/L (ref 98–111)
Creatinine, Ser: 0.95 mg/dL (ref 0.44–1.00)
GFR, Estimated: 59 mL/min — ABNORMAL LOW (ref >60)
Glucose, Bld: 120 mg/dL — ABNORMAL HIGH (ref 70–99)
Potassium: 3.7 mmol/L (ref 3.5–5.1)
Sodium: 138 mmol/L (ref 135–145)

## 2022-05-25 LAB — CBC
HCT: 41.3 % (ref 36.0–46.0)
Hemoglobin: 13.8 g/dL (ref 12.0–15.0)
MCH: 29.1 pg (ref 26.0–34.0)
MCHC: 33.4 g/dL (ref 30.0–36.0)
MCV: 86.9 fL (ref 80.0–100.0)
Platelets: 366 10*3/uL (ref 150–400)
RBC: 4.75 MIL/uL (ref 3.87–5.11)
RDW: 13 % (ref 11.5–15.5)
WBC: 9.3 10*3/uL (ref 4.0–10.5)
nRBC: 0 % (ref 0.0–0.2)

## 2022-05-25 NOTE — ED Provider Notes (Signed)
[facility] EMERGENCY DEPT Provider Note   CSN: [id number] Arrival date & time: 05/25/22  1421     History  Chief Complaint  Patient presents with   Fall   Facial Injury   Loss of Consciousness    Chelsea Morales is a 81 y.o. female.   Fall  Facial Injury Loss of Consciousness    Patient has a history of bronchiectasis, kidney stones.  Patient was recently diagnosed with COVID and finished a course of paxlovid this past week.  Patient states that she had a syncopal episode on Tuesday.  Patient fainted and fell forward striking her face.  She did have a nosebleed at the time.  She did develop some bruising and swelling around her face.  She has some soreness in the back of her neck.  Patient did not come in for evaluation.  She had been doing okay since then.  Patient has not had any trouble with any fevers or shortness of breath.  Patient is scheduled to take a trip next week so she decided she should come to get checked out.  She went to the [facility] walk-in clinic and they sent her to the emergency room to get x-rays.  Home Medications Prior to Admission medications   Medication Sig Start Date End Date Taking? Authorizing Provider  calcium-vitamin D 250-100 MG-UNIT tablet Take 2 tablets by mouth 2 (two) times daily.    [provider]  magnesium 30 MG tablet Take 30 mg by mouth 2 (two) times daily.    [provider]  Respiratory Therapy Supplies (FLUTTER) DEVI Use as directed 07/28/15   Nyoka Cowden, MD  traZODone (DESYREL) 50 MG tablet  07/13/20   [provider]      Allergies    Sulfa antibiotics and Sulfamethoxazole-trimethoprim    Review of Systems   Review of Systems  Cardiovascular:  Positive for syncope.    Physical Exam Updated Vital Signs BP (!) 145/65 (BP Location: Right Arm)   Pulse 77   Temp 98.7 F (37.1 C) (Oral)   Resp 16   Ht 1.651 m (5\' 5" )   Wt 49 kg   SpO2 99%   BMI 17.97 kg/m  Physical  Exam Vitals and nursing note reviewed.  Constitutional:      General: She is not in acute distress.    Appearance: She is well-developed.  HENT:     Head: Normocephalic.     Comments: Bruising of the forehead and around the left eye, no deformity noted,    Right Ear: External ear normal.     Left Ear: External ear normal.  Eyes:     General: No scleral icterus.       Right eye: No discharge.        Left eye: No discharge.     Conjunctiva/sclera: Conjunctivae normal.  Neck:     Trachea: No tracheal deviation.  Cardiovascular:     Rate and Rhythm: Normal rate and regular rhythm.  Pulmonary:     Effort: Pulmonary effort is normal. No respiratory distress.     Breath sounds: Normal breath sounds. No stridor. No wheezing or rales.  Abdominal:     General: Bowel sounds are normal. There is no distension.     Palpations: Abdomen is soft.     Tenderness: There is no abdominal tenderness. There is no guarding or rebound.  Musculoskeletal:        General: Tenderness present. No deformity.     Cervical back:  Neck supple.     Comments: Mild tenderness palpation cervical spine, no tenderness to palpation bilateral upper extremities or lower extremities, full range of motion bilateral upper extremities and lower extremities without tenderness or discomfort  Skin:    General: Skin is warm and dry.     Findings: No rash.  Neurological:     General: No focal deficit present.     Mental Status: She is alert.     Cranial Nerves: No cranial nerve deficit (no facial droop, extraocular movements intact, no slurred speech).     Sensory: No sensory deficit.     Motor: No abnormal muscle tone or seizure activity.     Coordination: Coordination normal.  Psychiatric:        Mood and Affect: Mood normal.     ED Results / Procedures / Treatments   Labs (all labs ordered are listed, but only abnormal results are displayed) Labs Reviewed  BASIC METABOLIC PANEL - Abnormal; Notable for the following  components:      Result Value   Glucose, Bld 120 (*)    BUN 25 (*)    GFR, Estimated 59 (*)    All other components within normal limits  CBC    EKG EKG Interpretation  Date/Time:  Saturday May 25 2022 15:22:38 EDT Ventricular Rate:  92 PR Interval:  114 QRS Duration: 74 QT Interval:  352 QTC Calculation: 435 R Axis:   47 Text Interpretation: Normal sinus rhythm Right atrial enlargement Nonspecific ST and T wave abnormality Abnormal ECG No previous ECGs available Confirmed by Linwood Dibbles [phone]) on 05/25/2022 3:46:17 PM  Radiology CT Maxillofacial Wo Contrast  Result Date: 05/25/2022 CLINICAL DATA:  Trauma, syncope, fall EXAM: CT MAXILLOFACIAL WITHOUT CONTRAST TECHNIQUE: Multidetector CT imaging of the maxillofacial structures was performed. Multiplanar CT image reconstructions were also generated. RADIATION DOSE REDUCTION: This exam was performed according to the departmental dose-optimization program which includes automated exposure control, adjustment of the mA and/or kV according to patient size and/or use of iterative reconstruction technique. COMPARISON:  None Available. FINDINGS: Osseous: No displaced fractures are seen. Orbits: Optic globes are symmetrical. Retrobulbar soft tissues are unremarkable. There is no evidence of blowout fracture. Sinuses: There are no air-fluid levels. There is mucosal thickening in the ethmoid, sphenoid and left maxillary sinuses. Soft tissues: There is soft tissue contusion in subcutaneous plane in the left frontal scalp. Limited intracranial: Unremarkable. IMPRESSION: No recent fracture is seen and facial bones. No focal abnormalities are seen in the orbits. Chronic sinusitis. Electronically Signed   By: Ernie Avena M.D.   On: 05/25/2022 16:55   CT Cervical Spine Wo Contrast  Result Date: 05/25/2022 CLINICAL DATA:  Syncope, fall EXAM: CT CERVICAL SPINE WITHOUT CONTRAST TECHNIQUE: Multidetector CT imaging of the cervical spine was performed  without intravenous contrast. Multiplanar CT image reconstructions were also generated. RADIATION DOSE REDUCTION: This exam was performed according to the departmental dose-optimization program which includes automated exposure control, adjustment of the mA and/or kV according to patient size and/or use of iterative reconstruction technique. COMPARISON:  None Available. FINDINGS: Alignment: There is minimal anterolisthesis at C4-C5 and C6-C7 levels, most likely due to previous ligament injury and facet degeneration. Skull base and vertebrae: No recent fracture is seen. Soft tissues and spinal canal: There is no central spinal stenosis. Disc levels: There is encroachment of neural foramina by bony spurs and facet hypertrophy from C3 to C7 levels. Upper chest: Pleural thickening is seen in both apices. This most likely suggest scarring.  Similar finding was seen in previous CT chest done on 02/06/2015. Other: There is inhomogeneous attenuation and thyroid. IMPRESSION: No recent fracture is seen in cervical spine. Minimal anterolisthesis at C4-C5 and C6-C7 levels may be due to previous ligamentous injury. 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 Pleural thickening is seen in both apices suggesting scarring. Electronically Signed   By: Ernie Avena M.D.   On: 05/25/2022 16:52   CT Head Wo Contrast  Result Date: 05/25/2022 CLINICAL DATA:  Syncope, fall EXAM: CT HEAD WITHOUT CONTRAST TECHNIQUE: Contiguous axial images were obtained from the base of the skull through the vertex without intravenous contrast. RADIATION DOSE REDUCTION: This exam was performed according to the departmental dose-optimization program which includes automated exposure control, adjustment of the mA and/or kV according to patient size and/or use of iterative reconstruction technique. COMPARISON:  None Available. FINDINGS: Brain: No acute intracranial findings are seen. There are no signs of bleeding  within the cranium. Cortical sulci are prominent. There is decreased density in periventricular white matter. Vascular: Unremarkable. Skull: No fracture is seen. Sinuses/Orbits: There is mucosal thickening in right side of sphenoid sinus. There is mild mucosal thickening in ethmoid sinus. There are no air-fluid levels in the paranasal sinuses. Other: None. IMPRESSION: No acute intracranial findings are seen in noncontrast CT brain. Atrophy. Chronic sinusitis. Electronically Signed   By: Ernie Avena M.D.   On: 05/25/2022 16:47    Procedures Procedures    Medications Ordered in ED Medications - No data to display  ED Course/ Medical Decision Making/ A&P Clinical Course as of 05/25/22 1732  Sat May 25, 2022  1704 Basic metabolic panel(!) Laboratory tests including CBC and metabolic panel without acute findings [JK]  1704 CT Cervical Spine Wo Contrast No acute fracture or dislocation noted.  Head CT and facial CT without acute findings [JK]    Clinical Course User Index [JK] Linwood Dibbles, MD                           Medical Decision Making Problems Addressed: Contusion of face, initial encounter: acute illness or injury that poses a threat to life or bodily functions Syncope, unspecified syncope type: acute illness or injury that poses a threat to life or bodily functions  Amount and/or Complexity of Data Reviewed Labs: ordered. Decision-making details documented in ED Course. Radiology: ordered and independent interpretation performed. Decision-making details documented in ED Course.   Patient presented to the ED for evaluation after having a syncopal episode 5 days ago.  Patient did have obvious patient with bruising and swelling noted.  I was concerned about the possibility of cervical spine fracture, or intracerebral hemorrhage.  Fortunately CT scan does not show any signs of fracture or hemorrhage.  Patient's laboratory tests are unremarkable.  Patient's not had any recurrent  symptoms over the last several days since the spell that she had.  Unclear etiology but the patient was recently diagnosed with COVID.  Is possible she may have been dehydrated.  At this time patient appears stable for discharge.  Recommend outpatient follow-up with her primary doctor.  Could consider further evaluation such as outpatient cardiac monitoring if she has any recurrent spells.  Warning signs precautions discussed.        Final Clinical Impression(s) / ED Diagnoses Final diagnoses:  Syncope, unspecified syncope type  Contusion of face, initial encounter    Rx / DC Orders ED Discharge Orders  None         Linwood Dibbles, MD 05/25/22 1735

## 2022-05-25 NOTE — ED Triage Notes (Signed)
Patient arrives requesting to be evaluated after having a syncopal episode five days ago, causing her to fall face forward. Patient was not treated the day of the incident. Patient went to her PCP earlier today and she was sent here for further evaluation. Reports a headache since fall. Does not take blood thinners.   Recently diagnosed with Covid on 8/12 and complete a course of Paxlovid.

## 2022-05-25 NOTE — Discharge Instructions (Signed)
Take over-the-counter medications such as Tylenol as needed for pain and discomfort.  Return to emergency room if you have any recurrent fainting spells.

## 2022-05-25 NOTE — ED Notes (Signed)
Pt stated she had a syncopal episode 5 days ago and fell forward. Pt has old bruising to her face. Pt stated she does not hurt anywhere at this time. Pt stated she has had H/A on and off during the week. Pt stated she went to her PCP today and he advised Pt to go to the ER to be evaluated.

## 2022-07-11 DIAGNOSIS — Z1331 Encounter for screening for depression: Secondary | ICD-10-CM | POA: Diagnosis not present

## 2022-07-11 DIAGNOSIS — Z0000 Encounter for general adult medical examination without abnormal findings: Secondary | ICD-10-CM | POA: Diagnosis not present

## 2022-07-11 DIAGNOSIS — E7800 Pure hypercholesterolemia, unspecified: Secondary | ICD-10-CM | POA: Diagnosis not present

## 2022-07-11 DIAGNOSIS — M810 Age-related osteoporosis without current pathological fracture: Secondary | ICD-10-CM | POA: Diagnosis not present

## 2022-07-11 DIAGNOSIS — Z23 Encounter for immunization: Secondary | ICD-10-CM | POA: Diagnosis not present

## 2022-07-11 DIAGNOSIS — F439 Reaction to severe stress, unspecified: Secondary | ICD-10-CM | POA: Diagnosis not present

## 2022-07-11 DIAGNOSIS — J479 Bronchiectasis, uncomplicated: Secondary | ICD-10-CM | POA: Diagnosis not present

## 2022-07-11 DIAGNOSIS — F5101 Primary insomnia: Secondary | ICD-10-CM | POA: Diagnosis not present

## 2022-07-16 DIAGNOSIS — H6123 Impacted cerumen, bilateral: Secondary | ICD-10-CM | POA: Diagnosis not present

## 2022-08-26 DIAGNOSIS — H9113 Presbycusis, bilateral: Secondary | ICD-10-CM | POA: Diagnosis not present

## 2022-09-03 ENCOUNTER — Encounter: Payer: Self-pay | Admitting: Internal Medicine

## 2022-09-23 ENCOUNTER — Ambulatory Visit: Payer: Medicare PPO | Admitting: Nurse Practitioner

## 2022-10-01 ENCOUNTER — Ambulatory Visit (INDEPENDENT_AMBULATORY_CARE_PROVIDER_SITE_OTHER): Payer: Medicare PPO | Admitting: Pulmonary Disease

## 2022-10-01 ENCOUNTER — Encounter: Payer: Self-pay | Admitting: Adult Health

## 2022-10-01 ENCOUNTER — Ambulatory Visit: Payer: Medicare PPO | Admitting: Adult Health

## 2022-10-01 VITALS — BP 100/50 | HR 97 | Temp 97.8°F | Ht 65.0 in | Wt 108.8 lb

## 2022-10-01 DIAGNOSIS — J479 Bronchiectasis, uncomplicated: Secondary | ICD-10-CM | POA: Diagnosis not present

## 2022-10-01 DIAGNOSIS — E46 Unspecified protein-calorie malnutrition: Secondary | ICD-10-CM | POA: Insufficient documentation

## 2022-10-01 DIAGNOSIS — E441 Mild protein-calorie malnutrition: Secondary | ICD-10-CM | POA: Diagnosis not present

## 2022-10-01 LAB — PULMONARY FUNCTION TEST
DL/VA % pred: 86 %
DL/VA: 3.47 ml/min/mmHg/L
DLCO cor % pred: 88 %
DLCO cor: 17.09 ml/min/mmHg
DLCO unc % pred: 88 %
DLCO unc: 17.09 ml/min/mmHg
FEF 25-75 Post: 1.52 L/sec
FEF 25-75 Pre: 1.14 L/sec
FEF2575-%Change-Post: 33 %
FEF2575-%Pred-Post: 115 %
FEF2575-%Pred-Pre: 86 %
FEV1-%Change-Post: 7 %
FEV1-%Pred-Post: 105 %
FEV1-%Pred-Pre: 98 %
FEV1-Post: 2.04 L
FEV1-Pre: 1.9 L
FEV1FVC-%Change-Post: 5 %
FEV1FVC-%Pred-Pre: 93 %
FEV6-%Change-Post: 3 %
FEV6-%Pred-Post: 113 %
FEV6-%Pred-Pre: 110 %
FEV6-Post: 2.79 L
FEV6-Pre: 2.71 L
FEV6FVC-%Change-Post: 1 %
FEV6FVC-%Pred-Post: 106 %
FEV6FVC-%Pred-Pre: 104 %
FVC-%Change-Post: 1 %
FVC-%Pred-Post: 107 %
FVC-%Pred-Pre: 105 %
FVC-Post: 2.79 L
FVC-Pre: 2.76 L
Post FEV1/FVC ratio: 73 %
Post FEV6/FVC ratio: 100 %
Pre FEV1/FVC ratio: 69 %
Pre FEV6/FVC Ratio: 98 %
RV % pred: 96 %
RV: 2.42 L
TLC % pred: 101 %
TLC: 5.31 L

## 2022-10-01 NOTE — Patient Instructions (Signed)
Full PFT Performed Today  

## 2022-10-01 NOTE — Assessment & Plan Note (Signed)
Encouraged on high-protein diet 

## 2022-10-01 NOTE — Progress Notes (Signed)
Full PFT Performed Today  

## 2022-10-01 NOTE — Patient Instructions (Addendum)
Activity as tolerated Saline nasal rinses twice daily as needed High-protein diet Flutter valve twice daily as needed for cough and congestion Follow-up with Dr. Marchelle Gearing in 1 year and and as needed

## 2022-10-01 NOTE — Progress Notes (Signed)
@Patient  ID: , female    DOB: April 13, 1941, 81 y.o.   MRN: [id number]  Chief Complaint  Patient presents with   Follow-up    Referring provider: [id number], MD  HPI: 81 year old female  followed for bronchiectasis  TEST/EVENTS :  CT chest Feb 06, 2015 showed bronchiectasis and scarring in the right middle lobe and lingula  bronchoscopy with lavage 02/07/2016. Results showed Pseudomonas fluorescence.   PFTs August 02, 2020 showed FEV1 at 96%, ratio 69, FVC 104%, DLCO 91%. PFTs October 01, 2022 showed FEV1 at 105%, ratio 73, FVC 107%, no significant bronchodilator response, DLCO not 88%.  10/01/2022 Follow up ; Bronchiectasis Patient presents for 82-month follow-up.  Management says overall breathing is doing okay.  Denies any flare of cough or wheezing.  Has a flutter valve but does not use it on a regular basis.  PFTs were done today and remained stable.  FEV1 at 105%, ratio 73, FVC 107%.,  No significant bronchodilator response and DLCO at 88%.  Very similar to 2021. Chest x-ray June 2023 showed stable bronchiectasis in the right middle lobe. Influenza, Covid booster , RSV, Prevnar 13 and Pneumovax are up-to-date. Cough is mainly at night, coughs a few times with some mucus each night.  She denies any fever, discolored mucus, hemoptysis, chest pain. Patient says she is very active.  Hikes weekly she is in a hiking club.  Walks daily. Says she did have COVID-19 infection in August.  She did take Paxlovid.  Says she had a dizzy episode and fell.  Had a nasal fracture due to this.  Took her a while to get over.  Says that she did lose some weight after this about 5 pounds.  Says she is eating well now.   Allergies  Allergen Reactions   Sulfa Antibiotics Anaphylaxis   Sulfamethoxazole-Trimethoprim     Other reaction(s): hives throat swell    Immunization History  Administered Date(s) Administered   Fluad Quad(high Dose 65+) 08/30/2022   Influenza, High  Dose Seasonal PF 09/05/2016, 06/05/2017, 07/27/2018   Influenza,inj,Quad PF,6+ Mos 06/24/2014, 07/28/2015   PFIZER(Purple Top)SARS-COV-2 Vaccination 11/11/2019, 12/06/2019, 07/11/2020   Pfizer Covid-19 Vaccine Bivalent Booster 60yrs & up 08/07/2022   Pneumococcal Conjugate-13 06/24/2014   Pneumococcal Polysaccharide-23 07/27/2018    Past Medical History:  Diagnosis Date   Bronchiectasis (HCC)    Hematuria    Kidney stones    1985   Ruptured ear drum    childhood    Tobacco History: Social History   Tobacco Use  Smoking Status Former   Packs/day: 0.25   Years: 1.00   Total pack years: 0.25   Types: Cigarettes   Quit date: 10/08/1959   Years since quitting: 63.0  Smokeless Tobacco Never   Counseling given: Not Answered   Outpatient Medications Prior to Visit  Medication Sig Dispense Refill   calcium-vitamin D 250-100 MG-UNIT tablet Take 2 tablets by mouth 2 (two) times daily.     magnesium 30 MG tablet Take 30 mg by mouth 2 (two) times daily.     OVER THE COUNTER MEDICATION Place 1 patch onto the skin as needed. Sleep patch, all natural     Respiratory Therapy Supplies (FLUTTER) DEVI Use as directed 1 each 0   traZODone (DESYREL) 50 MG tablet      No facility-administered medications prior to visit.     Review of Systems:   Constitutional:   No  weight loss, night sweats,  Fevers, chills, fatigue, or  lassitude.  HEENT:   No headaches,  Difficulty swallowing,  Tooth/dental problems, or  Sore throat,                No sneezing, itching, ear ache, nasal congestion, post nasal drip,   CV:  No chest pain,  Orthopnea, PND, swelling in lower extremities, anasarca, dizziness, palpitations, syncope.   GI  No heartburn, indigestion, abdominal pain, nausea, vomiting, diarrhea, change in bowel habits, loss of appetite, bloody stools.   Resp: No shortness of breath with exertion or at rest.  No excess mucus, no productive cough,  No non-productive cough,  No coughing up of  blood.  No change in color of mucus.  No wheezing.  No chest wall deformity  Skin: no rash or lesions.  GU: no dysuria, change in color of urine, no urgency or frequency.  No flank pain, no hematuria   MS:  No joint pain or swelling.  No decreased range of motion.  No back pain.    Physical Exam  BP (!) 100/50 (BP Location: Left Arm, Patient Position: Sitting, Cuff Size: Normal)   Pulse 97   Temp 97.8 F (36.6 C) (Oral)   Ht 5\' 5"  (1.651 m)   Wt 108 lb 12.8 oz (49.4 kg)   SpO2 98%   BMI 18.11 kg/m   GEN: A/Ox3; pleasant , NAD, well nourished    HEENT:  [place]/AT,  EACs-clear, TMs-wnl, NOSE-clear, THROAT-clear, no lesions, no postnasal drip or exudate noted.   NECK:  Supple w/ fair ROM; no JVD; normal carotid impulses w/o bruits; no thyromegaly or nodules palpated; no lymphadenopathy.    RESP  Clear  P & A; w/o, wheezes/ rales/ or rhonchi. no accessory muscle use, no dullness to percussion  CARD:  RRR, no m/r/g, no peripheral edema, pulses intact, no cyanosis or clubbing.  GI:   Soft & nt; nml bowel sounds; no organomegaly or masses detected.   Musco: Warm bil, no deformities or joint swelling noted.   Neuro: alert, no focal deficits noted.    Skin: Warm, no lesions or rashes    Lab Results:  CBC    Component Value Date/Time   WBC 9.3 05/25/2022 1513   RBC 4.75 05/25/2022 1513   HGB 13.8 05/25/2022 1513   HCT 41.3 05/25/2022 1513   PLT 366 05/25/2022 1513   MCV 86.9 05/25/2022 1513   MCH 29.1 05/25/2022 1513   MCHC 33.4 05/25/2022 1513   RDW 13.0 05/25/2022 1513   LYMPHSABS 1.7 12/14/2015 1003   MONOABS 0.4 12/14/2015 1003   EOSABS 0.2 12/14/2015 1003   BASOSABS 0.1 12/14/2015 1003    BMET    Component Value Date/Time   NA 138 05/25/2022 1513   K 3.7 05/25/2022 1513   CL 98 05/25/2022 1513   CO2 28 05/25/2022 1513   GLUCOSE 120 (H) 05/25/2022 1513   BUN 25 (H) 05/25/2022 1513   CREATININE 0.95 05/25/2022 1513   CALCIUM 9.7 05/25/2022 1513   GFRNONAA  59 (L) 05/25/2022 1513    BNP No results found for: "BNP"  ProBNP No results found for: "PROBNP"  Imaging: No results found.       Latest Ref Rng & Units 10/01/2022    8:49 AM 08/02/2020   10:59 AM 07/28/2015    8:55 AM  PFT Results  FVC-Pre L 2.76  P 2.80  2.94   FVC-Predicted Pre % 105  P 104  101   FVC-Post L 2.79  P  2.77   FVC-Predicted Post %  107  P  95   Pre FEV1/FVC % % 69  P 69  73   Post FEV1/FCV % % 73  P  77   FEV1-Pre L 1.90  P 1.93  2.14   FEV1-Predicted Pre % 98  P 96  99   FEV1-Post L 2.04  P  2.14   DLCO uncorrected ml/min/mmHg 17.09  P 17.69    DLCO UNC% % 88  P 91    DLCO corrected ml/min/mmHg 17.09  P 17.69    DLCO COR %Predicted % 88  P 91    DLVA Predicted % 86  P 90    TLC L 5.31  P  5.33   TLC % Predicted % 101  P  102   RV % Predicted % 96  P  101     P Preliminary result    No results found for: "NITRICOXIDE"      Assessment & Plan:   Bronchiectasis without complication (HCC) Bronchiectasis.  PFTs are stable today.  We reviewed in detail.  Patient is encouraged on mucociliary clearance.  Activity as tolerated.  Plan  Patient Instructions  Activity as tolerated Saline nasal rinses twice daily as needed High-protein diet Flutter valve twice daily as needed for cough and congestion Follow-up with Dr. Marchelle Gearing in 1 year and and as needed    Protein calorie malnutrition (HCC) Encouraged on high-protein diet     Rubye Oaks, NP 10/01/2022

## 2022-10-01 NOTE — Assessment & Plan Note (Signed)
Bronchiectasis.  PFTs are stable today.  We reviewed in detail.  Patient is encouraged on mucociliary clearance.  Activity as tolerated.  Plan  Patient Instructions  Activity as tolerated Saline nasal rinses twice daily as needed High-protein diet Flutter valve twice daily as needed for cough and congestion Follow-up with Dr. Marchelle Gearing in 1 year and and as needed

## 2022-10-09 DIAGNOSIS — Z1211 Encounter for screening for malignant neoplasm of colon: Secondary | ICD-10-CM | POA: Diagnosis not present

## 2022-11-12 DIAGNOSIS — H43811 Vitreous degeneration, right eye: Secondary | ICD-10-CM | POA: Diagnosis not present

## 2022-11-12 DIAGNOSIS — H5213 Myopia, bilateral: Secondary | ICD-10-CM | POA: Diagnosis not present

## 2022-11-12 DIAGNOSIS — Z961 Presence of intraocular lens: Secondary | ICD-10-CM | POA: Diagnosis not present

## 2023-02-12 DIAGNOSIS — M1612 Unilateral primary osteoarthritis, left hip: Secondary | ICD-10-CM | POA: Diagnosis not present

## 2023-02-12 DIAGNOSIS — M25552 Pain in left hip: Secondary | ICD-10-CM | POA: Diagnosis not present

## 2023-02-17 ENCOUNTER — Encounter (HOSPITAL_COMMUNITY)
Admission: RE | Admit: 2023-02-17 | Discharge: 2023-02-17 | Disposition: A | Discharge status: Home / Self Care | Payer: Medicare PPO | Source: Ambulatory Visit | Attending: Orthopedic Surgery | Admitting: Orthopedic Surgery

## 2023-02-17 ENCOUNTER — Encounter (HOSPITAL_COMMUNITY): Payer: Self-pay

## 2023-02-17 ENCOUNTER — Other Ambulatory Visit: Payer: Self-pay

## 2023-02-17 VITALS — BP 138/69 | HR 98 | Temp 98.2°F | Resp 16 | Ht 65.0 in | Wt 106.2 lb

## 2023-02-17 DIAGNOSIS — Z01818 Encounter for other preprocedural examination: Secondary | ICD-10-CM

## 2023-02-17 DIAGNOSIS — N289 Disorder of kidney and ureter, unspecified: Secondary | ICD-10-CM

## 2023-02-17 DIAGNOSIS — Z01812 Encounter for preprocedural laboratory examination: Secondary | ICD-10-CM | POA: Diagnosis not present

## 2023-02-17 DIAGNOSIS — Z87891 Personal history of nicotine dependence: Secondary | ICD-10-CM | POA: Insufficient documentation

## 2023-02-17 DIAGNOSIS — M1612 Unilateral primary osteoarthritis, left hip: Secondary | ICD-10-CM | POA: Insufficient documentation

## 2023-02-17 HISTORY — DX: Unspecified osteoarthritis, unspecified site: M19.90

## 2023-02-17 HISTORY — DX: Unspecified protein-calorie malnutrition: E46

## 2023-02-17 HISTORY — DX: Personal history of urinary calculi: Z87.442

## 2023-02-17 HISTORY — DX: Anxiety disorder, unspecified: F41.9

## 2023-02-17 HISTORY — DX: Pneumonia, unspecified organism: J18.9

## 2023-02-17 HISTORY — DX: Depression, unspecified: F32.A

## 2023-02-17 LAB — CBC
HCT: 39.3 % (ref 36.0–46.0)
Hemoglobin: 12.5 g/dL (ref 12.0–15.0)
MCH: 28.7 pg (ref 26.0–34.0)
MCHC: 31.8 g/dL (ref 30.0–36.0)
MCV: 90.3 fL (ref 80.0–100.0)
Platelets: 331 10*3/uL (ref 150–400)
RBC: 4.35 MIL/uL (ref 3.87–5.11)
RDW: 13.2 % (ref 11.5–15.5)
WBC: 8.3 10*3/uL (ref 4.0–10.5)
nRBC: 0 % (ref 0.0–0.2)

## 2023-02-17 LAB — SURGICAL PCR SCREEN
MRSA, PCR: NEGATIVE
Staphylococcus aureus: NEGATIVE

## 2023-02-17 LAB — BASIC METABOLIC PANEL
Anion gap: 9 (ref 5–15)
BUN: 18 mg/dL (ref 8–23)
CO2: 26 mmol/L (ref 22–32)
Calcium: 9.5 mg/dL (ref 8.9–10.3)
Chloride: 108 mmol/L (ref 98–111)
Creatinine, Ser: 0.88 mg/dL (ref 0.44–1.00)
GFR, Estimated: >60 mL/min (ref >60)
Glucose, Bld: 123 mg/dL — ABNORMAL HIGH (ref 70–99)
Potassium: 5.1 mmol/L (ref 3.5–5.1)
Sodium: 143 mmol/L (ref 135–145)

## 2023-02-17 NOTE — Progress Notes (Signed)
Sent a high priority message, via epic in basket, requesting orders in epic from [facility]. Also spoke with Cordelia Pen at [employer].

## 2023-02-17 NOTE — Progress Notes (Addendum)
COVID Vaccine Completed:  Yes  Date of COVID positive in last 90 days:  No  PCP - Merri Brunette, MD Cardiologist - N/A Pulmonologist - Kalman Shan, MD  Chest x-ray - 03-26-22 Epic EKG - 05-25-22 Epic Stress Test - N/A ECHO - N/A Cardiac Cath - N/A Pacemaker/ICD device last checked: Spinal Cord Stimulator:N/A  Bowel Prep - N/A  Sleep Study - N/A CPAP -   Fasting Blood Sugar - N/A Checks Blood Sugar _____ times a day  Last dose of GLP1 agonist-  N/A GLP1 instructions:  N/A   Last dose of SGLT-2 inhibitors-  N/A SGLT-2 instructions: N/A   Blood Thinner Instructions:  N/A Aspirin Instructions: Last Dose:  Activity level:  Can go up a flight of stairs and perform activities of daily living without stopping and without symptoms of chest pain or shortness of breath.  Able to exercise without symptoms  Anesthesia review: Bronchiectasis followed by pulmonology.  Patient states that she will have occasional shortness of breath with bronchiectasis flares but this is rare.  Patient denies shortness of breath, fever, cough and chest pain at PAT appointment  Patient verbalized understanding of instructions that were given to them at the PAT appointment. Patient was also instructed that they will need to review over the PAT instructions again at home before surgery.

## 2023-02-17 NOTE — Patient Instructions (Addendum)
SURGICAL WAITING ROOM VISITATION  Patients having surgery or a procedure may have no more than 2 support people in the waiting area - these visitors may rotate.    Children under the age of 36 must have an adult with them who is not the patient.  Due to an increase in RSV and influenza rates and associated hospitalizations, children ages 100 and under may not visit patients in [facility] hospitals.  If the patient needs to stay at the hospital during part of their recovery, the visitor guidelines for inpatient rooms apply. Pre-op nurse will coordinate an appropriate time for 1 support person to accompany patient in pre-op.  This support person may not rotate.    Please refer to the [facility] website for the visitor guidelines for Inpatients (after your surgery is over and you are in a regular room).       Your procedure is scheduled on: Tuesday, Feb 25, 2023   Report to [facility] Main Entrance    Report to admitting at 9:00 AM   Call this number if you have problems the morning of surgery [phone]   Do not eat food :After Midnight.   After Midnight you may have the following liquids until 8:30  AM DAY OF SURGERY  Water Non-Citrus Juices (without pulp, NO RED-Apple, White grape, White cranberry) Black Coffee (NO MILK/CREAM OR CREAMERS, sugar ok)  Clear Tea (NO MILK/CREAM OR CREAMERS, sugar ok) regular and decaf                             Plain Jell-O (NO RED)                                           Fruit ices (not with fruit pulp, NO RED)                                     Popsicles (NO RED)                                                               Sports drinks like Gatorade (NO RED)                      If you have questions, please contact your surgeon's office.   FOLLOW  ANY ADDITIONAL PRE OP INSTRUCTIONS YOU RECEIVED FROM YOUR SURGEON'S OFFICE!!!     Oral Hygiene is also important to reduce your risk of infection.                                     Remember - BRUSH YOUR TEETH THE MORNING OF SURGERY WITH YOUR REGULAR TOOTHPASTE  DENTURES WILL BE REMOVED PRIOR TO SURGERY PLEASE DO NOT APPLY "Poly grip" OR ADHESIVES!!!   Do NOT smoke after Midnight   Take these medicines the morning of surgery with A SIP OF WATER:  None  You may not have any metal on your body including hair pins, jewelry, and body piercing             Do not wear make-up, lotions, powders, perfumes or deodorant  Do not wear nail polish including gel and S&S, artificial/acrylic nails, or any other type of covering on natural nails including finger and toenails. If you have artificial nails, gel coating, etc. that needs to be removed by a nail salon please have this removed prior to surgery or surgery may need to be canceled/ delayed if the surgeon/ anesthesia feels like they are unable to be safely monitored.   Do not shave  48 hours prior to surgery.    Do not bring valuables to the hospital. [facility] IS NOT  RESPONSIBLE   FOR VALUABLES.   Contacts, glasses, dentures or bridgework may not be worn into surgery.   Bring small overnight bag day of surgery.   DO NOT BRING YOUR HOME MEDICATIONS TO THE HOSPITAL. PHARMACY WILL DISPENSE MEDICATIONS LISTED ON YOUR MEDICATION LIST TO YOU DURING YOUR ADMISSION IN THE HOSPITAL!   Special Instructions: Bring a copy of your healthcare power of attorney and living will documents the day of surgery if you haven't scanned them before.              Please read over the following fact sheets you were given: IF YOU HAVE QUESTIONS ABOUT YOUR PRE-OP INSTRUCTIONS PLEASE CALL [phone] [facility]   If you received a COVID test during your pre-op visit  it is requested that you wear a mask when out in public, stay away from anyone that may not be feeling well and notify your surgeon if you develop symptoms. If you test positive for Covid or have been in contact with anyone that has tested positive in the last  10 days please notify you surgeon.    Pre-operative 5 CHG Bath Instructions   You can play a key role in reducing the risk of infection after surgery. Your skin needs to be as free of germs as possible. You can reduce the number of germs on your skin by washing with CHG (chlorhexidine gluconate) soap before surgery. CHG is an antiseptic soap that kills germs and continues to kill germs even after washing.   DO NOT use if you have an allergy to chlorhexidine/CHG or antibacterial soaps. If your skin becomes reddened or irritated, stop using the CHG and notify one of our RNs at [phone].   Please shower with the CHG soap starting 4 days before surgery using the following schedule:     Please keep in mind the following:  DO NOT shave, including legs and underarms, starting the day of your first shower.   You may shave your face at any point before/day of surgery.  Place clean sheets on your bed the day you start using CHG soap. Use a clean washcloth (not used since being washed) for each shower. DO NOT sleep with pets once you start using the CHG.   CHG Shower Instructions:  If you choose to wash your hair and private area, wash first with your normal shampoo/soap.  After you use shampoo/soap, rinse your hair and body thoroughly to remove shampoo/soap residue.  Turn the water OFF and apply about 3 tablespoons (45 ml) of CHG soap to a CLEAN washcloth.  Apply CHG soap ONLY FROM YOUR NECK DOWN TO YOUR TOES (washing for 3-5 minutes)  DO NOT use CHG soap on face, private areas, open wounds,  or sores.  Pay special attention to the area where your surgery is being performed.  If you are having back surgery, having someone wash your back for you may be helpful. Wait 2 minutes after CHG soap is applied, then you may rinse off the CHG soap.  Pat dry with a clean towel  Put on clean clothes/pajamas   If you choose to wear lotion, please use ONLY the CHG-compatible lotions on the back of this  paper.     Additional instructions for the day of surgery: DO NOT APPLY any lotions, deodorants, cologne, or perfumes.   Put on clean/comfortable clothes.  Brush your teeth.  Ask your nurse before applying any prescription medications to the skin.      CHG Compatible Lotions   Aveeno Moisturizing lotion  Cetaphil Moisturizing Cream  Cetaphil Moisturizing Lotion  Clairol Herbal Essence Moisturizing Lotion, Dry Skin  Clairol Herbal Essence Moisturizing Lotion, Extra Dry Skin  Clairol Herbal Essence Moisturizing Lotion, Normal Skin  Curel Age Defying Therapeutic Moisturizing Lotion with Alpha Hydroxy  Curel Extreme Care Body Lotion  Curel Soothing Hands Moisturizing Hand Lotion  Curel Therapeutic Moisturizing Cream, Fragrance-Free  Curel Therapeutic Moisturizing Lotion, Fragrance-Free  Curel Therapeutic Moisturizing Lotion, Original Formula  Eucerin Daily Replenishing Lotion  Eucerin Dry Skin Therapy Plus Alpha Hydroxy Crme  Eucerin Dry Skin Therapy Plus Alpha Hydroxy Lotion  Eucerin Original Crme  Eucerin Original Lotion  Eucerin Plus Crme Eucerin Plus Lotion  Eucerin TriLipid Replenishing Lotion  Keri Anti-Bacterial Hand Lotion  Keri Deep Conditioning Original Lotion Dry Skin Formula Softly Scented  Keri Deep Conditioning Original Lotion, Fragrance Free Sensitive Skin Formula  Keri Lotion Fast Absorbing Fragrance Free Sensitive Skin Formula  Keri Lotion Fast Absorbing Softly Scented Dry Skin Formula  Keri Original Lotion  Keri Skin Renewal Lotion Keri Silky Smooth Lotion  Keri Silky Smooth Sensitive Skin Lotion  Nivea Body Creamy Conditioning Oil  Nivea Body Extra Enriched Teacher, adult education Moisturizing Lotion Nivea Crme  Nivea Skin Firming Lotion  NutraDerm 30 Skin Lotion  NutraDerm Skin Lotion  NutraDerm Therapeutic Skin Cream  NutraDerm Therapeutic Skin Lotion  ProShield Protective Hand Cream  Provon moisturizing lotion     WHAT IS A BLOOD TRANSFUSION? Blood Transfusion Information  A transfusion is the replacement of blood or some of its parts. Blood is made up of multiple cells which provide different functions. Red blood cells carry oxygen and are used for blood loss replacement. White blood cells fight against infection. Platelets control bleeding. Plasma helps clot blood. Other blood products are available for specialized needs, such as hemophilia or other clotting disorders. BEFORE THE TRANSFUSION  Who gives blood for transfusions?  Healthy volunteers who are fully evaluated to make sure their blood is safe. This is blood bank blood. Transfusion therapy is the safest it has ever been in the practice of medicine. Before blood is taken from a donor, a complete history is taken to make sure that person has no history of diseases nor engages in risky social behavior (examples are intravenous drug use or sexual activity with multiple partners). The donor's travel history is screened to minimize risk of transmitting infections, such as malaria. The donated blood is tested for signs of infectious diseases, such as HIV and hepatitis. The blood is then tested to be sure it is compatible with you in order to minimize the chance of a transfusion reaction. If you or a relative donates blood, this is often done  in anticipation of surgery and is not appropriate for emergency situations. It takes many days to process the donated blood. RISKS AND COMPLICATIONS Although transfusion therapy is very safe and saves many lives, the main dangers of transfusion include:  Getting an infectious disease. Developing a transfusion reaction. This is an allergic reaction to something in the blood you were given. Every precaution is taken to prevent this. The decision to have a blood transfusion has been considered carefully by your caregiver before blood is given. Blood is not given unless the benefits outweigh the risks. AFTER THE  TRANSFUSION Right after receiving a blood transfusion, you will usually feel much better and more energetic. This is especially true if your red blood cells have gotten low (anemic). The transfusion raises the level of the red blood cells which carry oxygen, and this usually causes an energy increase. The nurse administering the transfusion will monitor you carefully for complications. HOME CARE INSTRUCTIONS  No special instructions are needed after a transfusion. You may find your energy is better. Speak with your caregiver about any limitations on activity for underlying diseases you may have. SEEK MEDICAL CARE IF:  Your condition is not improving after your transfusion. You develop redness or irritation at the intravenous (IV) site. SEEK IMMEDIATE MEDICAL CARE IF:  Any of the following symptoms occur over the next 12 hours: Shaking chills. You have a temperature by mouth above 102 F (38.9 C), not controlled by medicine. Chest, back, or muscle pain. People around you feel you are not acting correctly or are confused. Shortness of breath or difficulty breathing. Dizziness and fainting. You get a rash or develop hives. You have a decrease in urine output. Your urine turns a dark color or changes to pink, red, or brown. Any of the following symptoms occur over the next 10 days: You have a temperature by mouth above 102 F (38.9 C), not controlled by medicine. Shortness of breath. Weakness after normal activity. The white part of the eye turns yellow (jaundice). You have a decrease in the amount of urine or are urinating less often. Your urine turns a dark color or changes to pink, red, or brown. Document Released: 09/20/2000 Document Revised: 12/16/2011 Document Reviewed: 05/09/2008 [facility] Patient Information 2014 [place], [place].  _______________________________________________________________________

## 2023-02-18 NOTE — Progress Notes (Signed)
Case: [id number] Date/Time: 02/25/23 1115   Procedure: TOTAL HIP ARTHROPLASTY ANTERIOR APPROACH (Left: Hip)   Anesthesia type: Spinal   Pre-op diagnosis: Left hip osteoarthritis   Location: [facility] ROOM 10 / [facility]   Surgeons: Durene Romans, MD       DISCUSSION: Chelsea Morales is an 82 year old female who presents to PAT prior to surgery listed above.  Past medical history significant for bronchiectasis followed by pulmonology, former smoking. No prior anesthesia encounters.  Patient was diagnosed with COVID in August 2023 and was seen in the ED on 05/25/2022 for a syncopal episode.  Workup was unremarkable.  Etiology was unclear but it was suspected she was dehydrated. She was advised to follow-up with cardiology if she had recurrent syncopal episodes which it does not appear she had to.  Patient last saw pulmonology on 10/01/22.  She has noted to be doing well at that time with minimal symptoms.  She does have a chronic productive cough and takes Mucinex.  She does not use inhalers.  Was advised to follow-up in a year.  Denies chest pain, shortness of breath or cough at PAT visit.     VS: BP 138/69   Pulse 98   Temp 36.8 C (Oral)   Resp 16   Ht 5\' 5"  (1.651 m)   Wt 48.2 kg   SpO2 100%   BMI 17.67 kg/m   PROVIDERS: Merri Brunette, MD Pulmonology: Kalman Shan, MD   LABS: Labs reviewed: Acceptable for surgery. (all labs ordered are listed, but only abnormal results are displayed)  Labs Reviewed  BASIC METABOLIC PANEL - Abnormal; Notable for the following components:      Result Value   Glucose, Bld 123 (*)    All other components within normal limits  SURGICAL PCR SCREEN  CBC  TYPE AND SCREEN     IMAGES:  CXR 03/26/22: FINDINGS: The heart size and mediastinal contours are within normal limits. Previously noted bronchiectasis in the medial right lung base is unchanged. The lungs are hyperinflated. There is no focal pneumonia or pleural effusion. The visualized  skeletal structures are unremarkable.   IMPRESSION: No active cardiopulmonary disease. COPD.     EKG 05/25/22:  Normal sinus rhythm Right atrial enlargement Nonspecific ST and T wave abnormality   CV: n/a  Past Medical History:  Diagnosis Date   Anxiety    Arthritis    Bronchiectasis (HCC)    Depression    Hematuria    History of kidney stones    Kidney stones    1985   Pneumonia    Protein calorie malnutrition (HCC)    Ruptured ear drum    childhood   Syncopal episodes 05/2022    Past Surgical History:  Procedure Laterality Date   BREAST BIOPSY Right 08/08/2006   COLONOSCOPY     OOPHORECTOMY     VIDEO BRONCHOSCOPY Bilateral 02/07/2016   Procedure: VIDEO BRONCHOSCOPY WITHOUT FLUORO;  Surgeon: Kalman Shan, MD;  [facility];  Service: Cardiopulmonary;  Laterality: Bilateral;    MEDICATIONS:  calcium-vitamin D 250-100 MG-UNIT tablet   melatonin 5 MG TABS   Respiratory Therapy Supplies (FLUTTER) DEVI   traZODone (DESYREL) 50 MG tablet   No current facility-administered medications for this encounter.   Marcille Blanco [facility] Short Stay/Anesthesiology [facility] Phone [phone] 02/18/2023 11:28 AM

## 2023-02-18 NOTE — Anesthesia Preprocedure Evaluation (Addendum)
Anesthesia Evaluation  Patient identified by MRN, date of birth, ID band Patient awake    Reviewed: Allergy & Precautions, H&P , NPO status , Patient's Chart, lab work & pertinent test results  Airway Mallampati: II   Neck ROM: full    Dental   Pulmonary former smoker   breath sounds clear to auscultation       Cardiovascular negative cardio ROS  Rhythm:regular Rate:Normal     Neuro/Psych  PSYCHIATRIC DISORDERS Anxiety Depression       GI/Hepatic   Endo/Other    Renal/GU      Musculoskeletal  (+) Arthritis ,    Abdominal   Peds  Hematology   Anesthesia Other Findings   Reproductive/Obstetrics                             Anesthesia Physical Anesthesia Plan  ASA: 2  Anesthesia Plan: MAC and Spinal   Post-op Pain Management:    Induction: Intravenous  PONV Risk Score and Plan: 2 and Propofol infusion, Ondansetron and Treatment may vary due to age or medical condition  Airway Management Planned: Simple Face Mask  Additional Equipment:   Intra-op Plan:   Post-operative Plan:   Informed Consent: I have reviewed the patients History and Physical, chart, labs and discussed the procedure including the risks, benefits and alternatives for the proposed anesthesia with the patient or authorized representative who has indicated his/her understanding and acceptance.     Dental advisory given  Plan Discussed with: CRNA, Anesthesiologist and Surgeon  Anesthesia Plan Comments: (See PAT note on 5/13 by Sherlie Ban PA-C)        Anesthesia Quick Evaluation

## 2023-02-19 IMAGING — DX DG CHEST 2V
2 series · 2 of 2 positions shown · non-contrast
Comparison: July 27, 2018

CLINICAL DATA: Bronchiectasis

EXAM:
CHEST - 2 VIEW

[chest pa]
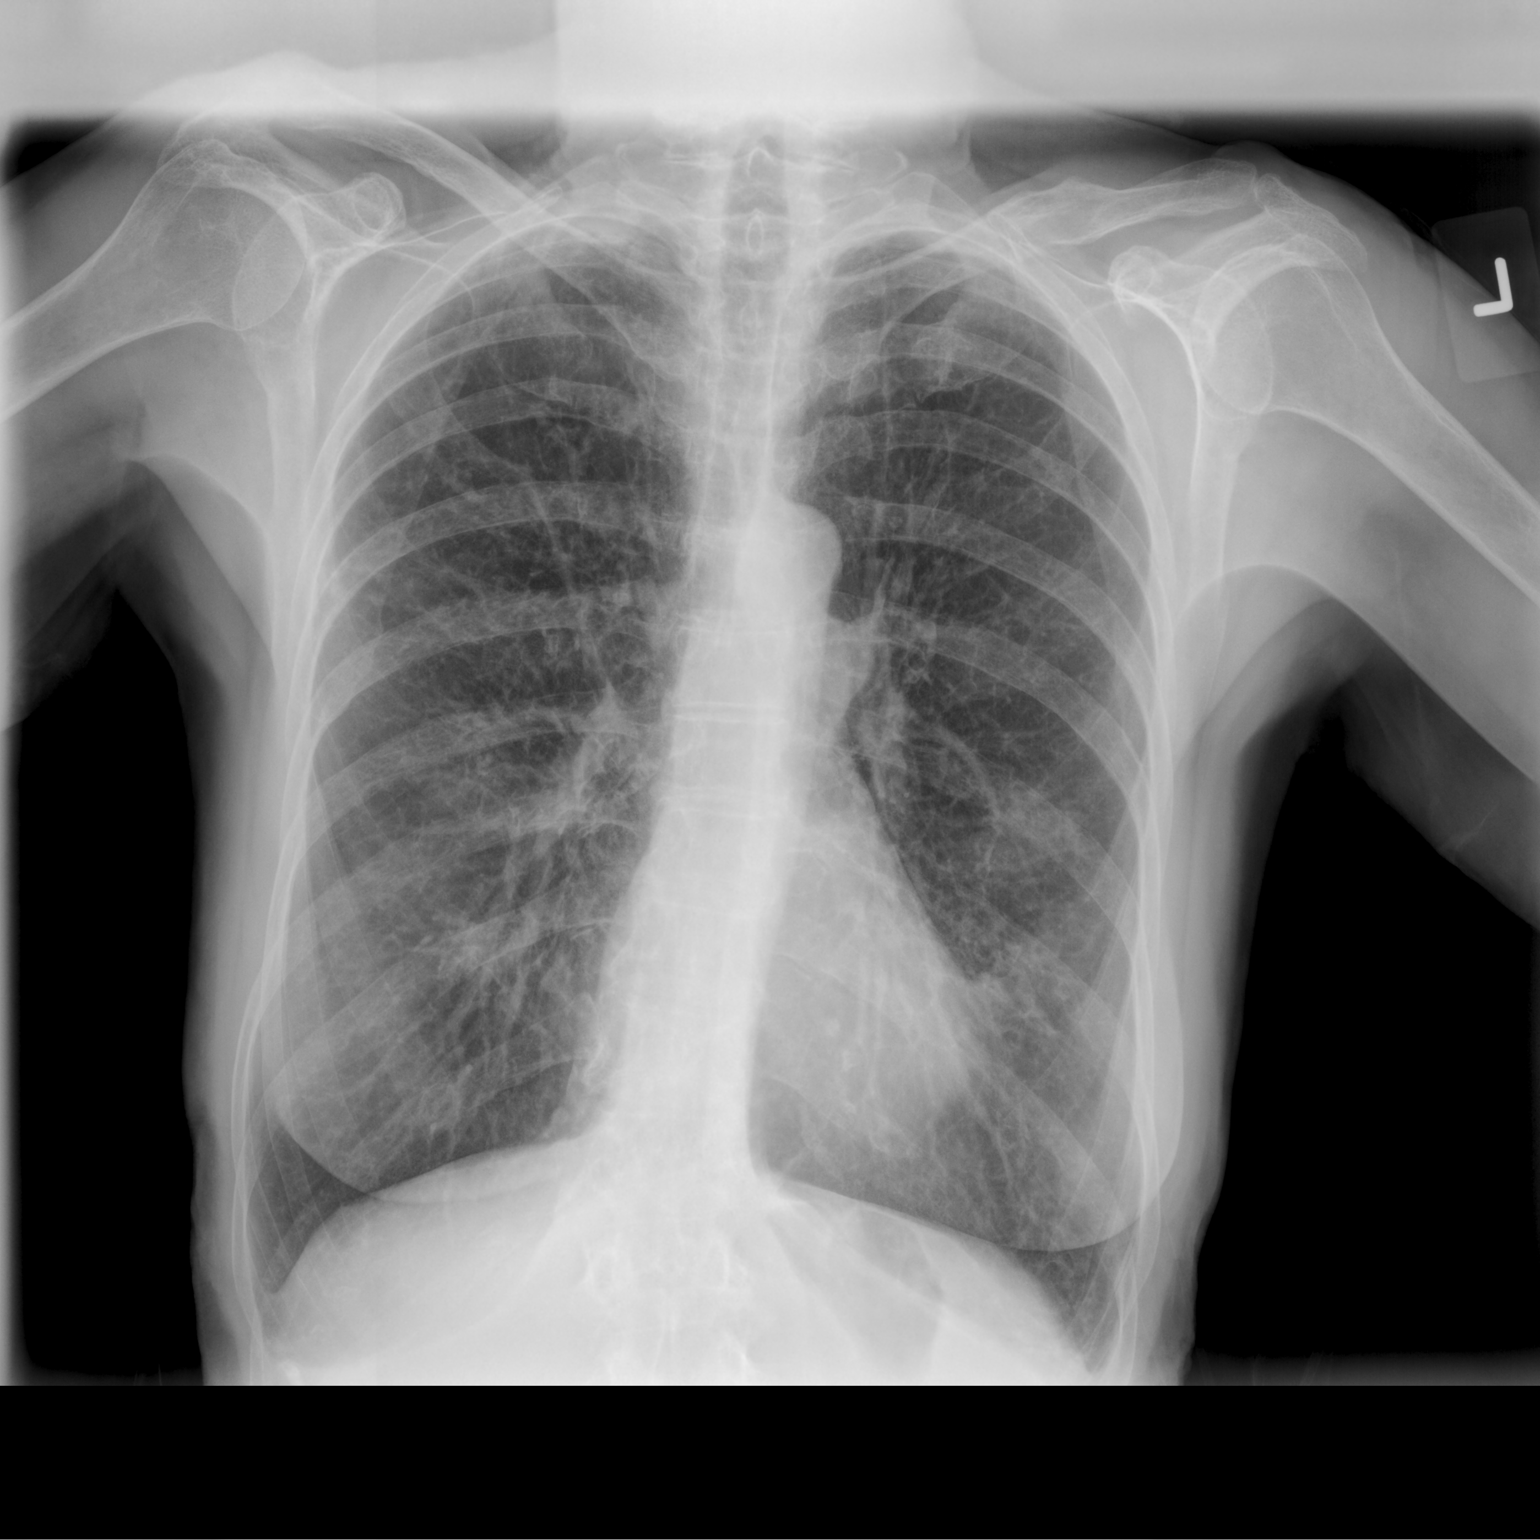

[chest lat]
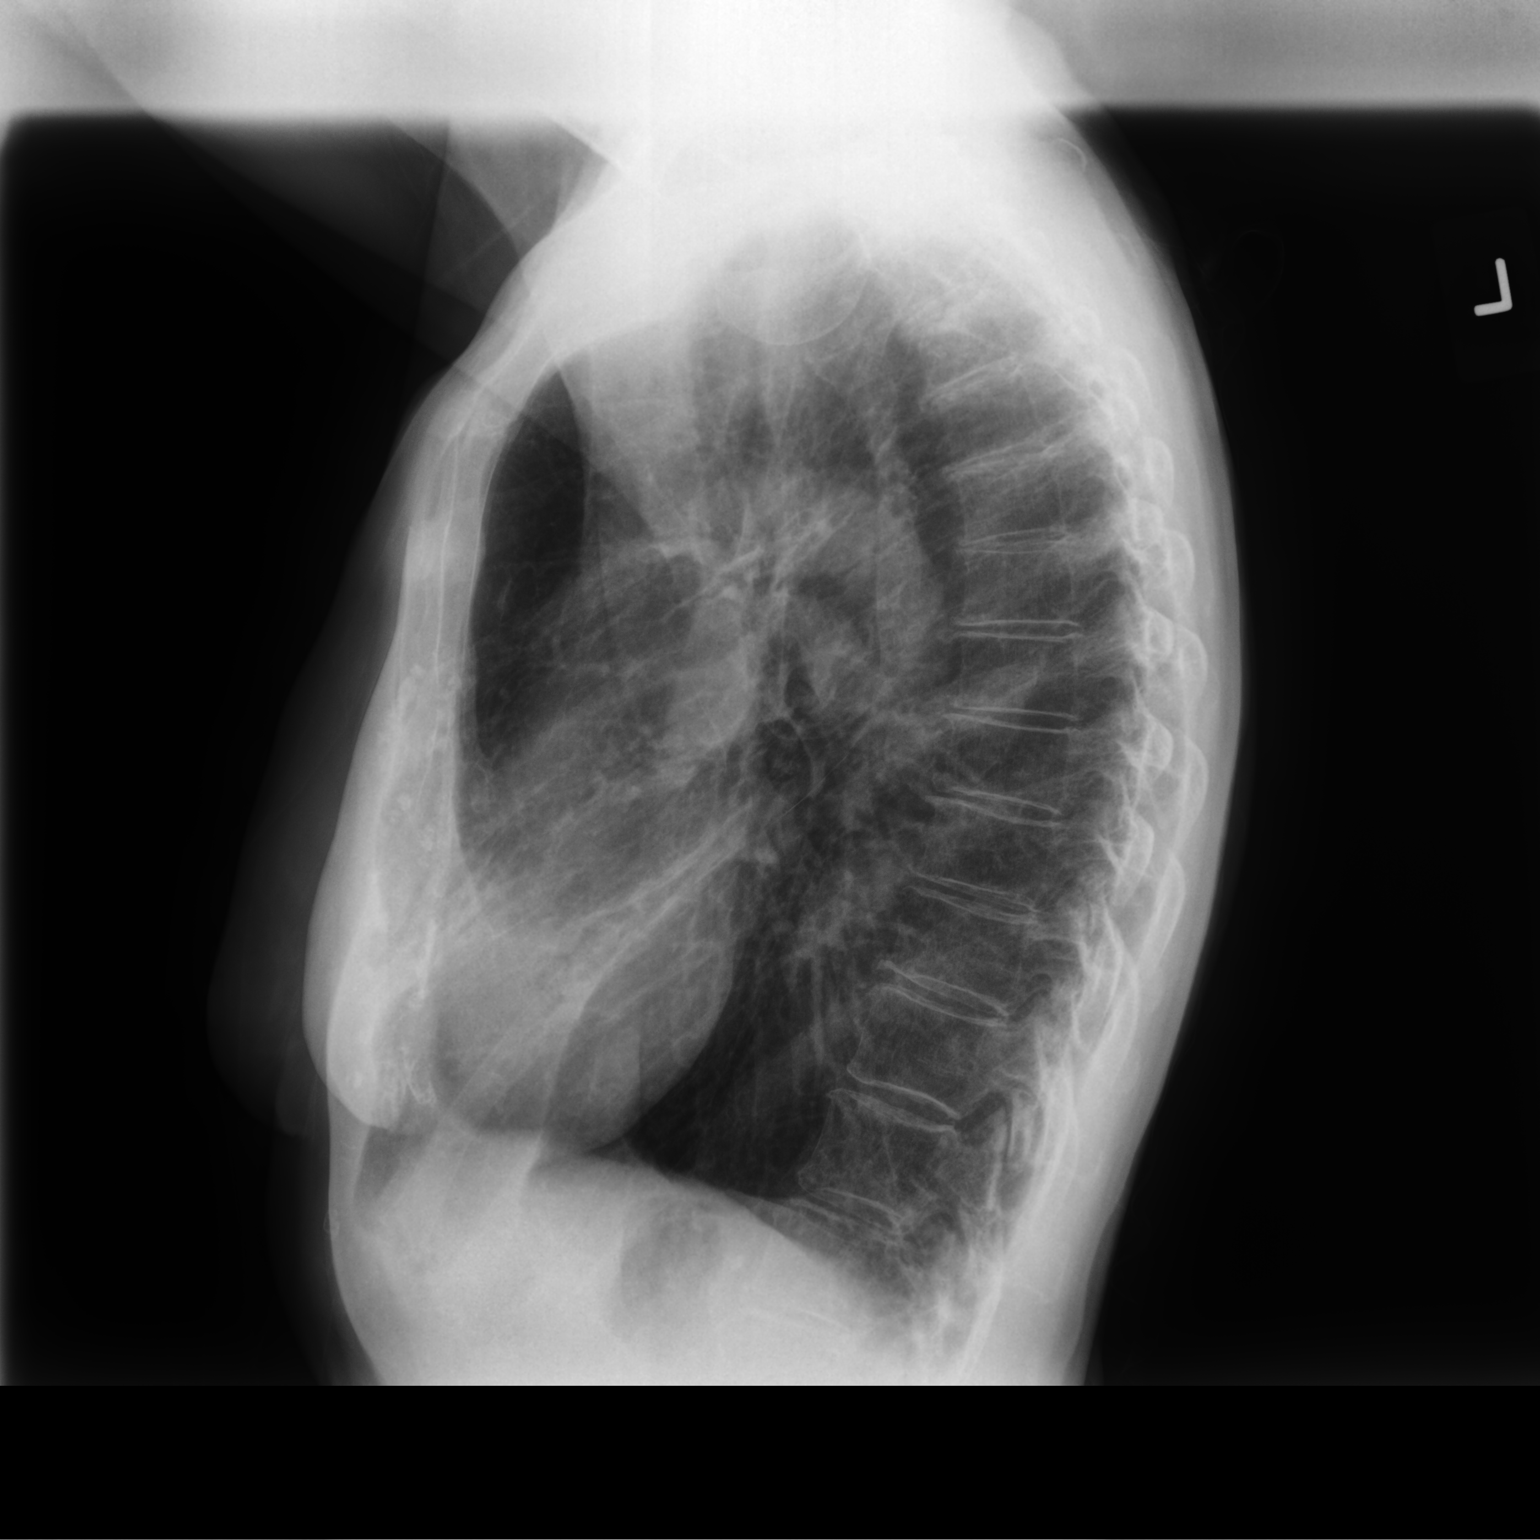

[2 of 2 positions shown; findings below may reference images not displayed]

FINDINGS: The heart size and mediastinal contours are within normal limits.
Previously noted bronchiectasis in the medial right lung base is
unchanged. The lungs are hyperinflated. There is no focal pneumonia
or pleural effusion. The visualized skeletal structures are
unremarkable.
IMPRESSION: No active cardiopulmonary disease. COPD.

## 2023-02-20 DIAGNOSIS — Z01818 Encounter for other preprocedural examination: Secondary | ICD-10-CM | POA: Diagnosis not present

## 2023-02-20 DIAGNOSIS — E7800 Pure hypercholesterolemia, unspecified: Secondary | ICD-10-CM | POA: Diagnosis not present

## 2023-02-20 DIAGNOSIS — M810 Age-related osteoporosis without current pathological fracture: Secondary | ICD-10-CM | POA: Diagnosis not present

## 2023-02-20 DIAGNOSIS — J479 Bronchiectasis, uncomplicated: Secondary | ICD-10-CM | POA: Diagnosis not present

## 2023-02-24 ENCOUNTER — Encounter (HOSPITAL_COMMUNITY): Payer: Medicare PPO

## 2023-02-25 ENCOUNTER — Observation Stay (HOSPITAL_COMMUNITY): Payer: Medicare PPO

## 2023-02-25 ENCOUNTER — Ambulatory Visit (HOSPITAL_BASED_OUTPATIENT_CLINIC_OR_DEPARTMENT_OTHER): Payer: Medicare PPO | Admitting: Certified Registered"

## 2023-02-25 ENCOUNTER — Ambulatory Visit (HOSPITAL_COMMUNITY): Payer: Medicare PPO | Admitting: Medical

## 2023-02-25 ENCOUNTER — Observation Stay (HOSPITAL_COMMUNITY)
Admission: RE | Admit: 2023-02-25 | Discharge: 2023-02-26 | Disposition: A | Discharge status: Home / Self Care | Payer: Medicare PPO | Attending: Orthopedic Surgery | Admitting: Orthopedic Surgery

## 2023-02-25 ENCOUNTER — Encounter (HOSPITAL_COMMUNITY): Payer: Self-pay | Admitting: Orthopedic Surgery

## 2023-02-25 ENCOUNTER — Ambulatory Visit (HOSPITAL_COMMUNITY): Payer: Medicare PPO

## 2023-02-25 ENCOUNTER — Other Ambulatory Visit: Payer: Self-pay

## 2023-02-25 ENCOUNTER — Encounter (HOSPITAL_COMMUNITY)
Admission: RE | Disposition: A | Discharge status: Home / Self Care | Payer: Self-pay | Source: Home / Self Care | Attending: Orthopedic Surgery

## 2023-02-25 DIAGNOSIS — Z87891 Personal history of nicotine dependence: Secondary | ICD-10-CM

## 2023-02-25 DIAGNOSIS — M1612 Unilateral primary osteoarthritis, left hip: Principal | ICD-10-CM | POA: Insufficient documentation

## 2023-02-25 DIAGNOSIS — F418 Other specified anxiety disorders: Secondary | ICD-10-CM

## 2023-02-25 DIAGNOSIS — Z96642 Presence of left artificial hip joint: Secondary | ICD-10-CM

## 2023-02-25 DIAGNOSIS — Z01818 Encounter for other preprocedural examination: Secondary | ICD-10-CM

## 2023-02-25 DIAGNOSIS — M25752 Osteophyte, left hip: Secondary | ICD-10-CM

## 2023-02-25 DIAGNOSIS — Z471 Aftercare following joint replacement surgery: Secondary | ICD-10-CM | POA: Diagnosis not present

## 2023-02-25 HISTORY — PX: TOTAL HIP ARTHROPLASTY: SHX124

## 2023-02-25 LAB — TYPE AND SCREEN
ABO/RH(D): AB POS
Antibody Screen: NEGATIVE

## 2023-02-25 LAB — ABO/RH: ABO/RH(D): AB POS

## 2023-02-25 SURGERY — ARTHROPLASTY, HIP, TOTAL, ANTERIOR APPROACH
Anesthesia: Monitor Anesthesia Care | Site: Hip | Laterality: Left

## 2023-02-25 MED ORDER — POVIDONE-IODINE 10 % EX SWAB: 2.0000 | Freq: Once | CUTANEOUS | Status: DC

## 2023-02-25 MED ORDER — FENTANYL CITRATE PF 50 MCG/ML IJ SOSY: 25.0000 ug | PREFILLED_SYRINGE | INTRAMUSCULAR | Status: DC | PRN

## 2023-02-25 MED ORDER — LACTATED RINGERS IV SOLN: INTRAVENOUS | Status: DC

## 2023-02-25 MED ORDER — EPHEDRINE SULFATE (PRESSORS) 50 MG/ML IJ SOLN
INTRAMUSCULAR | Status: DC | PRN
  Administered 2023-02-25 (×2): 10 mg via INTRAVENOUS

## 2023-02-25 MED ORDER — ONDANSETRON HCL 4 MG/2ML IJ SOLN: 4.0000 mg | Freq: Four times a day (QID) | INTRAMUSCULAR | Status: DC | PRN

## 2023-02-25 MED ORDER — BUPIVACAINE-EPINEPHRINE 0.5% -1:200000 IJ SOLN
INTRAMUSCULAR | Status: DC | PRN
  Administered 2023-02-25: 30 mL

## 2023-02-25 MED ORDER — TRANEXAMIC ACID-NACL 1000-0.7 MG/100ML-% IV SOLN
1000.0000 mg | INTRAVENOUS | Status: AC
  Administered 2023-02-25: 1000 mg via INTRAVENOUS
  Filled 2023-02-25: qty 100

## 2023-02-25 MED ORDER — 0.9 % SODIUM CHLORIDE (POUR BTL) OPTIME
TOPICAL | Status: DC | PRN
  Administered 2023-02-25: 1000 mL

## 2023-02-25 MED ORDER — HYDROMORPHONE HCL 1 MG/ML IJ SOLN: 0.5000 mg | INTRAMUSCULAR | Status: DC | PRN

## 2023-02-25 MED ORDER — CHLORHEXIDINE GLUCONATE 0.12 % MT SOLN
15.0000 mL | Freq: Once | OROMUCOSAL | Status: AC
  Administered 2023-02-25: 15 mL via OROMUCOSAL

## 2023-02-25 MED ORDER — OXYCODONE HCL 5 MG PO TABS
5.0000 mg | ORAL_TABLET | ORAL | Status: DC | PRN
  Administered 2023-02-25 – 2023-02-26 (×2): 5 mg via ORAL
  Filled 2023-02-25 (×2): qty 1

## 2023-02-25 MED ORDER — MELATONIN 5 MG PO TABS: 5.0000 mg | ORAL_TABLET | Freq: Every evening | ORAL | Status: DC | PRN

## 2023-02-25 MED ORDER — METHOCARBAMOL 500 MG IVPB - SIMPLE MED: 500.0000 mg | Freq: Four times a day (QID) | INTRAVENOUS | Status: DC | PRN

## 2023-02-25 MED ORDER — STERILE WATER FOR IRRIGATION IR SOLN
Status: DC | PRN
  Administered 2023-02-25: 2000 mL

## 2023-02-25 MED ORDER — KETOROLAC TROMETHAMINE 30 MG/ML IJ SOLN
INTRAMUSCULAR | Status: DC | PRN
  Administered 2023-02-25: 30 mg

## 2023-02-25 MED ORDER — FENTANYL CITRATE (PF) 100 MCG/2ML IJ SOLN
INTRAMUSCULAR | Status: DC | PRN
  Administered 2023-02-25: 50 ug via INTRAVENOUS

## 2023-02-25 MED ORDER — PHENOL 1.4 % MT LIQD: 1.0000 | OROMUCOSAL | Status: DC | PRN

## 2023-02-25 MED ORDER — LACTATED RINGERS IV SOLN: INTRAVENOUS | Status: DC | PRN

## 2023-02-25 MED ORDER — SODIUM CHLORIDE (PF) 0.9 % IJ SOLN
INTRAMUSCULAR | Status: AC
  Filled 2023-02-25: qty 50

## 2023-02-25 MED ORDER — SODIUM CHLORIDE (PF) 0.9 % IJ SOLN
INTRAMUSCULAR | Status: DC | PRN
  Administered 2023-02-25: 30 mL

## 2023-02-25 MED ORDER — CEFAZOLIN SODIUM-DEXTROSE 2-4 GM/100ML-% IV SOLN
2.0000 g | INTRAVENOUS | Status: AC
  Administered 2023-02-25: 2 g via INTRAVENOUS
  Filled 2023-02-25: qty 100

## 2023-02-25 MED ORDER — ONDANSETRON HCL 4 MG PO TABS: 4.0000 mg | ORAL_TABLET | Freq: Four times a day (QID) | ORAL | Status: DC | PRN

## 2023-02-25 MED ORDER — OXYCODONE HCL 5 MG/5ML PO SOLN: 5.0000 mg | Freq: Once | ORAL | Status: DC | PRN

## 2023-02-25 MED ORDER — TRAZODONE HCL 50 MG PO TABS
50.0000 mg | ORAL_TABLET | Freq: Every day | ORAL | Status: DC
  Administered 2023-02-25: 50 mg via ORAL
  Filled 2023-02-25: qty 1

## 2023-02-25 MED ORDER — DEXAMETHASONE SODIUM PHOSPHATE 10 MG/ML IJ SOLN
10.0000 mg | Freq: Once | INTRAMUSCULAR | Status: AC
  Administered 2023-02-26: 10 mg via INTRAVENOUS
  Filled 2023-02-25: qty 1

## 2023-02-25 MED ORDER — BUPIVACAINE HCL (PF) 0.5 % IJ SOLN
INTRAMUSCULAR | Status: AC
  Filled 2023-02-25: qty 30

## 2023-02-25 MED ORDER — CEFAZOLIN SODIUM-DEXTROSE 2-4 GM/100ML-% IV SOLN
2.0000 g | Freq: Four times a day (QID) | INTRAVENOUS | Status: AC
  Administered 2023-02-25 (×2): 2 g via INTRAVENOUS
  Filled 2023-02-25 (×2): qty 100

## 2023-02-25 MED ORDER — ONDANSETRON HCL 4 MG/2ML IJ SOLN
INTRAMUSCULAR | Status: DC | PRN
  Administered 2023-02-25: 4 mg via INTRAVENOUS

## 2023-02-25 MED ORDER — METOCLOPRAMIDE HCL 5 MG PO TABS: 5.0000 mg | ORAL_TABLET | Freq: Three times a day (TID) | ORAL | Status: DC | PRN

## 2023-02-25 MED ORDER — TRANEXAMIC ACID-NACL 1000-0.7 MG/100ML-% IV SOLN
1000.0000 mg | Freq: Once | INTRAVENOUS | Status: AC
  Administered 2023-02-25: 1000 mg via INTRAVENOUS
  Filled 2023-02-25: qty 100

## 2023-02-25 MED ORDER — PROPOFOL 500 MG/50ML IV EMUL
INTRAVENOUS | Status: DC | PRN
  Administered 2023-02-25: 50 ug/kg/min via INTRAVENOUS

## 2023-02-25 MED ORDER — DEXAMETHASONE SODIUM PHOSPHATE 10 MG/ML IJ SOLN
8.0000 mg | Freq: Once | INTRAMUSCULAR | Status: AC
  Administered 2023-02-25: 8 mg via INTRAVENOUS

## 2023-02-25 MED ORDER — TRAMADOL HCL 50 MG PO TABS: 50.0000 mg | ORAL_TABLET | Freq: Four times a day (QID) | ORAL | Status: DC | PRN

## 2023-02-25 MED ORDER — POLYETHYLENE GLYCOL 3350 17 G PO PACK
17.0000 g | PACK | Freq: Two times a day (BID) | ORAL | Status: DC
  Administered 2023-02-25: 17 g via ORAL
  Filled 2023-02-25 (×2): qty 1

## 2023-02-25 MED ORDER — METOCLOPRAMIDE HCL 5 MG/ML IJ SOLN: 5.0000 mg | Freq: Three times a day (TID) | INTRAMUSCULAR | Status: DC | PRN

## 2023-02-25 MED ORDER — MENTHOL 3 MG MT LOZG: 1.0000 | LOZENGE | OROMUCOSAL | Status: DC | PRN

## 2023-02-25 MED ORDER — DIPHENHYDRAMINE HCL 12.5 MG/5ML PO ELIX: 12.5000 mg | ORAL_SOLUTION | ORAL | Status: DC | PRN

## 2023-02-25 MED ORDER — KETOROLAC TROMETHAMINE 30 MG/ML IJ SOLN
INTRAMUSCULAR | Status: AC
  Filled 2023-02-25: qty 1

## 2023-02-25 MED ORDER — ORAL CARE MOUTH RINSE: 15.0000 mL | Freq: Once | OROMUCOSAL | Status: AC

## 2023-02-25 MED ORDER — OXYCODONE HCL 5 MG PO TABS: 5.0000 mg | ORAL_TABLET | Freq: Once | ORAL | Status: DC | PRN

## 2023-02-25 MED ORDER — ASPIRIN 81 MG PO CHEW
81.0000 mg | CHEWABLE_TABLET | Freq: Two times a day (BID) | ORAL | Status: DC
  Administered 2023-02-25 – 2023-02-26 (×2): 81 mg via ORAL
  Filled 2023-02-25 (×2): qty 1

## 2023-02-25 MED ORDER — EPINEPHRINE PF 1 MG/ML IJ SOLN
INTRAMUSCULAR | Status: AC
  Filled 2023-02-25: qty 1

## 2023-02-25 MED ORDER — DOCUSATE SODIUM 100 MG PO CAPS
100.0000 mg | ORAL_CAPSULE | Freq: Two times a day (BID) | ORAL | Status: DC
  Administered 2023-02-25 – 2023-02-26 (×2): 100 mg via ORAL
  Filled 2023-02-25 (×2): qty 1

## 2023-02-25 MED ORDER — METHOCARBAMOL 500 MG PO TABS
500.0000 mg | ORAL_TABLET | Freq: Four times a day (QID) | ORAL | Status: DC | PRN
  Administered 2023-02-25: 500 mg via ORAL
  Filled 2023-02-25: qty 1

## 2023-02-25 MED ORDER — ACETAMINOPHEN 325 MG PO TABS: 325.0000 mg | ORAL_TABLET | Freq: Four times a day (QID) | ORAL | Status: DC | PRN

## 2023-02-25 MED ORDER — FENTANYL CITRATE (PF) 100 MCG/2ML IJ SOLN
INTRAMUSCULAR | Status: AC
  Filled 2023-02-25: qty 2

## 2023-02-25 MED ORDER — BISACODYL 10 MG RE SUPP: 10.0000 mg | Freq: Every day | RECTAL | Status: DC | PRN

## 2023-02-25 MED ORDER — SODIUM CHLORIDE 0.9 % IV SOLN: INTRAVENOUS | Status: DC

## 2023-02-25 SURGICAL SUPPLY — 43 items
ADH SKN CLS APL DERMABOND .7 (GAUZE/BANDAGES/DRESSINGS) ×1
BAG COUNTER SPONGE SURGICOUNT (BAG) IMPLANT
BAG SPEC THK2 15X12 ZIP CLS (MISCELLANEOUS)
BAG SPNG CNTER NS LX DISP (BAG)
BAG ZIPLOCK 12X15 (MISCELLANEOUS) IMPLANT
BLADE SAG 18X100X1.27 (BLADE) ×1 IMPLANT
COVER PERINEAL POST (MISCELLANEOUS) ×1 IMPLANT
COVER SURGICAL LIGHT HANDLE (MISCELLANEOUS) ×1 IMPLANT
CUP ACET PINNACLE SECTR 50MM (Hips) IMPLANT
DERMABOND ADVANCED .7 DNX12 (GAUZE/BANDAGES/DRESSINGS) ×1 IMPLANT
DRAPE FOOT SWITCH (DRAPES) ×1 IMPLANT
DRAPE STERI IOBAN 125X83 (DRAPES) ×1 IMPLANT
DRAPE U-SHAPE 47X51 STRL (DRAPES) ×2 IMPLANT
DRESSING AQUACEL AG SP 3.5X10 (GAUZE/BANDAGES/DRESSINGS) ×1 IMPLANT
DRSG AQUACEL AG SP 3.5X10 (GAUZE/BANDAGES/DRESSINGS) ×1
DURAPREP 26ML APPLICATOR (WOUND CARE) ×1 IMPLANT
ELECT REM PT RETURN 15FT ADLT (MISCELLANEOUS) ×1 IMPLANT
GLOVE BIO SURGEON STRL SZ 6 (GLOVE) ×1 IMPLANT
GLOVE BIOGEL PI IND STRL 6.5 (GLOVE) ×1 IMPLANT
GLOVE BIOGEL PI IND STRL 7.5 (GLOVE) ×1 IMPLANT
GLOVE ORTHO TXT STRL SZ7.5 (GLOVE) ×2 IMPLANT
GOWN STRL REUS W/ TWL LRG LVL3 (GOWN DISPOSABLE) ×2 IMPLANT
GOWN STRL REUS W/TWL LRG LVL3 (GOWN DISPOSABLE) ×2
HEAD FEM STD 32X+1 STRL (Hips) IMPLANT
HOLDER FOLEY CATH W/STRAP (MISCELLANEOUS) ×1 IMPLANT
KIT TURNOVER KIT A (KITS) IMPLANT
LINER ACET PNNCL PLUS4 NEUTRAL (Hips) IMPLANT
NDL SAFETY ECLIP 18X1.5 (MISCELLANEOUS) IMPLANT
PACK ANTERIOR HIP CUSTOM (KITS) ×1 IMPLANT
PINNACLE PLUS 4 NEUTRAL (Hips) ×1 IMPLANT
PINNACLE SECTOR CUP 50MM (Hips) ×1 IMPLANT
SCREW 6.5MMX35MM (Screw) IMPLANT
STEM FEM ACTIS HIGH SZ7 (Stem) IMPLANT
SUT MNCRL AB 4-0 PS2 18 (SUTURE) ×1 IMPLANT
SUT STRATAFIX 0 PDS 27 VIOLET (SUTURE) ×1
SUT VIC AB 1 CT1 36 (SUTURE) ×3 IMPLANT
SUT VIC AB 2-0 CT1 27 (SUTURE) ×2
SUT VIC AB 2-0 CT1 TAPERPNT 27 (SUTURE) ×2 IMPLANT
SUTURE STRATFX 0 PDS 27 VIOLET (SUTURE) ×1 IMPLANT
SYR 3ML 18GX1 1/2 (SYRINGE) IMPLANT
TRAY FOLEY MTR SLVR 16FR STAT (SET/KITS/TRAYS/PACK) IMPLANT
TUBE SUCTION HIGH CAP CLEAR NV (SUCTIONS) ×1 IMPLANT
WATER STERILE IRR 1000ML POUR (IV SOLUTION) ×1 IMPLANT

## 2023-02-25 NOTE — Care Plan (Signed)
Ortho Bundle Case Management Note  Patient Details  Name: Auria Dembek MRN: [id number] Date of Birth: 05/03/1941                  L THA on 02/25/23.  DCP: Home with husband.  DME: RW ordered through [employer].  PT: [facility]   DME Arranged:  Walker rolling DME Agency:  [employer]    Additional Comments: Please contact me with any questions of if this plan should need to change.    Despina Pole, Case Manager  [facility] [phone] 02/25/2023, 11:20 AM

## 2023-02-25 NOTE — Discharge Instructions (Signed)

## 2023-02-25 NOTE — Interval H&P Note (Signed)
History and Physical Interval Note:  02/25/2023 10:12 AM  Chelsea Morales  has presented today for surgery, with the diagnosis of Left hip osteoarthritis.  The various methods of treatment have been discussed with the patient and family. After consideration of risks, benefits and other options for treatment, the patient has consented to  Procedure(s): TOTAL HIP ARTHROPLASTY ANTERIOR APPROACH (Left) as a surgical intervention.  The patient's history has been reviewed, patient examined, no change in status, stable for surgery.  I have reviewed the patient's chart and labs.  Questions were answered to the patient's satisfaction.     Shelda Pal

## 2023-02-25 NOTE — H&P (Signed)
TOTAL HIP ADMISSION H&P  Patient is admitted for left total hip arthroplasty.  Subjective:  Chief Complaint: left hip pain  HPI: Chelsea Morales, 82 y.o. female, has a history of pain and functional disability in the left hip(s) due to arthritis and patient has failed non-surgical conservative treatments for greater than 12 weeks to include NSAID's and/or analgesics.  Onset of symptoms was gradual starting 2 years ago with gradually worsening course since that time.The patient noted no past surgery on the left hip(s).  Patient currently rates pain in the left hip at 9 out of 10 with activity. Patient has worsening of pain with activity and weight bearing, trendelenberg gait, pain that interfers with activities of daily living, and pain with passive range of motion. Patient has evidence of joint space narrowing by imaging studies. This condition presents safety issues increasing the risk of falls. There is no current active infection.  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   Bronchiectasis without acute exacerbation (HCC) 03/07/2015   Cough 06/17/2014   Past Medical History:  Diagnosis Date   Anxiety    Arthritis    Bronchiectasis (HCC)    Depression    Hematuria    History of kidney stones    Kidney stones    1985   Pneumonia    Protein calorie malnutrition (HCC)    Ruptured ear drum    childhood   Syncopal episodes 05/2022    Past Surgical History:  Procedure Laterality Date   BREAST BIOPSY Right 08/08/2006   COLONOSCOPY     OOPHORECTOMY     VIDEO BRONCHOSCOPY Bilateral 02/07/2016   Procedure: VIDEO BRONCHOSCOPY WITHOUT FLUORO;  Surgeon: Kalman Shan, MD;  [facility];  Service: Cardiopulmonary;  Laterality: Bilateral;    No current facility-administered medications for this encounter.    Current Outpatient Medications  Medication Sig Dispense Refill Last Dose   calcium-vitamin D 250-100 MG-UNIT tablet Take 2 tablets by mouth 2 (two) times daily.   Past Week   melatonin 5 MG TABS Take 5 mg by mouth at bedtime as needed (for sleep).   Past Week   traZODone (DESYREL) 50 MG tablet Take 50 mg by mouth at bedtime.   Past Week   Respiratory Therapy Supplies (FLUTTER) DEVI Use as directed (Patient not taking: Reported on 02/17/2023) 1 each 0 Not Taking   Allergies  Allergen Reactions   Sulfa Antibiotics Anaphylaxis   Sulfamethoxazole-Trimethoprim     Other reaction(s): hives throat swell    Social History   Tobacco Use   Smoking status: Former    Packs/day: 0.25    Years: 1.00    Additional pack years: 0.00    Total pack years: 0.25    Types: Cigarettes    Quit date: 10/08/1959    Years since quitting: 63.4   Smokeless tobacco: Never  Substance Use Topics   Alcohol use: Yes    Alcohol/week: 3.0 standard drinks of alcohol    Types: 3 Glasses of wine per week    Family History  Problem Relation Age of Onset   Breast cancer Mother    Colon cancer Mother    Heart attack Father    Heart failure Brother      Review of Systems  Constitutional:  Negative for chills and fever.  Respiratory:  Negative for cough and shortness of breath.   Cardiovascular:  Negative for chest pain.  Gastrointestinal:  Negative for nausea and vomiting.  Musculoskeletal:  Positive for arthralgias.     Objective:  Physical Exam Well nourished and well developed. General: Alert and oriented x3, cooperative and pleasant, no acute distress. Head: normocephalic, atraumatic, neck supple. Eyes: EOMI.  Musculoskeletal: Left hip exam: She has reproducible groin and posterior pain with hip flexion internal and external rotation with noted limitations in both. As compared to her right hip which moves more fluidly without pain Active hip flexion with 5/5 strength without significant external  rotation contracture She is otherwise neurovascular intact distally  Calves soft and nontender. Motor function intact in LE. Strength 5/5 LE bilaterally. Neuro: Distal pulses 2+. Sensation to light touch intact in LE.  Vital signs in last 24 hours:    Labs:   Estimated body mass index is 17.67 kg/m as calculated from the following:   Height as of 02/17/23: 5\' 5"  (1.651 m).   Weight as of 02/17/23: 48.2 kg.   Imaging Review Plain radiographs demonstrate severe degenerative joint disease of the left hip(s). The bone quality appears to be adequate for age and reported activity level.      Assessment/Plan:  End stage arthritis, left hip(s)  The patient history, physical examination, clinical judgement of the provider and imaging studies are consistent with end stage degenerative joint disease of the left hip(s) and total hip arthroplasty is deemed medically necessary. The treatment options including medical management, injection therapy, arthroscopy and arthroplasty were discussed at length. The risks and benefits of total hip arthroplasty were presented and reviewed. The risks due to aseptic loosening, infection, stiffness, dislocation/subluxation,  thromboembolic complications and other imponderables were discussed.  The patient acknowledged the explanation, agreed to proceed with the plan and consent was signed. Patient is being admitted for inpatient treatment for surgery, pain control, PT, OT, prophylactic antibiotics, VTE prophylaxis, progressive ambulation and ADL's and discharge planning.The patient is planning to be discharged  home.   Therapy Plans: [facility] Disposition: Home with husband Planned DVT Prophylaxis: aspirin 81mg  BID DME needed: walker PCP: Dr. Katrinka Blazing, appointment on 02/20/23 TXA: IV Allergies: sulfa - angioedema/anaphylaxis Anesthesia Concerns: none BMI: 18.1 Last HgbA1c: Not diabetic   Other: - No hx of VTE or cancer - No hx stomach ulcer or CKD -  Tramadol/oxycodone, robaxin, tylenol   Rosalene Billings, PA-C Orthopedic Surgery [facility] [place] [phone]

## 2023-02-25 NOTE — Transfer of Care (Signed)
Immediate Anesthesia Transfer of Care Note  Patient: Chelsea Morales  Procedure(s) Performed: TOTAL HIP ARTHROPLASTY ANTERIOR APPROACH (Left: Hip)  Patient Location: PACU  Anesthesia Type:MAC combined with regional for post-op pain  Level of Consciousness: awake and alert   Airway & Oxygen Therapy: Patient Spontanous Breathing and Patient connected to face mask oxygen  Post-op Assessment: Report given to RN and Post -op Vital signs reviewed and stable  Post vital signs: Reviewed and stable  Last Vitals:  Vitals Value Taken Time  BP    Temp    Pulse 81 02/25/23 1254  Resp 15 02/25/23 1254  SpO2 100 % 02/25/23 1254    Last Pain:  Vitals:   02/25/23 0922  TempSrc:   PainSc: 0-No pain         Complications: No notable events documented.

## 2023-02-25 NOTE — Anesthesia Postprocedure Evaluation (Signed)
Anesthesia Post Note  Patient: Chelsea Morales  Procedure(s) Performed: TOTAL HIP ARTHROPLASTY ANTERIOR APPROACH (Left: Hip)     Patient location during evaluation: PACU Anesthesia Type: MAC and Spinal Level of consciousness: oriented and awake and alert Pain management: pain level controlled Vital Signs Assessment: post-procedure vital signs reviewed and stable Respiratory status: spontaneous breathing, respiratory function stable and patient connected to nasal cannula oxygen Cardiovascular status: blood pressure returned to baseline and stable Postop Assessment: no headache, no backache and no apparent nausea or vomiting Anesthetic complications: no   No notable events documented.  Last Vitals:  Vitals:   02/25/23 1430 02/25/23 1533  BP: (!) 116/50 (!) 150/57  Pulse: 67 97  Resp: 15 17  Temp:  36.9 C  SpO2: 96% 96%    Last Pain:  Vitals:   02/25/23 1533  TempSrc: Oral  PainSc: 0-No pain                 Ahmiya Abee S

## 2023-02-25 NOTE — Op Note (Signed)
NAME:  Chelsea Morales.: [id number]      MEDICAL RECORD NO.: [id number]      FACILITY:  [facility]      PHYSICIAN:  Shelda Pal  DATE OF BIRTH:  [birth year]     DATE OF PROCEDURE:  02/25/2023                                 OPERATIVE REPORT         PREOPERATIVE DIAGNOSIS: Left  hip osteoarthritis.      POSTOPERATIVE DIAGNOSIS:  Left hip osteoarthritis.      PROCEDURE:  Left total hip replacement through an anterior approach   utilizing DePuy THR system, component size 50 mm pinnacle cup, a size 32+4 neutral   Altrex liner, a size 7 Hi Actis stem with a 32+1  Articuleze metal head ball.      SURGEON:  Madlyn Frankel. Charlann Boxer, M.D.      ASSISTANT:  Rosalene Billings, PA-C     ANESTHESIA:  Spinal.      SPECIMENS:  None.      COMPLICATIONS:  None.      BLOOD LOSS:  250 cc     DRAINS:  None.      INDICATION OF THE PROCEDURE:  Chelsea Morales is a 82 y.o. female who had   presented to office for evaluation of left hip pain.  Radiographs revealed   progressive degenerative changes with bone-on-bone   articulation of the  hip joint, including subchondral cystic changes and osteophytes.  The patient had painful limited range of   motion significantly affecting their overall quality of life and function.  The patient was failing to    respond to conservative measures including medications and/or injections and activity modification and at this point was ready   to proceed with more definitive measures.  Consent was obtained for   benefit of pain relief.  Specific risks of infection, DVT, component   failure, dislocation, neurovascular injury, and need for revision surgery were reviewed in the office.     PROCEDURE IN DETAIL:  The patient was brought to operative theater.   Once adequate anesthesia, preoperative antibiotics, 2 gm of Ancef, 1 gm of Tranexamic Acid, and 10 mg of Decadron were administered, the patient was positioned  supine on the [employer] table.  Once the patient was safely positioned with adequate padding of boney prominences we predraped out the hip, and used fluoroscopy to confirm orientation of the pelvis.      The left hip was then prepped and draped from proximal iliac crest to   mid thigh with a shower curtain technique.      Time-out was performed identifying the patient, planned procedure, and the appropriate extremity.     An incision was then made 2 cm lateral to the   anterior superior iliac spine extending over the orientation of the   tensor fascia lata muscle and sharp dissection was carried down to the   fascia of the muscle.      The fascia was then incised.  The muscle belly was identified and swept   laterally and retractor placed along the superior neck.  Following   cauterization of the circumflex vessels and removing some pericapsular   fat, a second cobra retractor was placed on the inferior  neck.  A T-capsulotomy was made along the line of the   superior neck to the trochanteric fossa, then extended proximally and   distally.  Tag sutures were placed and the retractors were then placed   intracapsular.  We then identified the trochanteric fossa and   orientation of my neck cut and then made a neck osteotomy with the femur on traction.  The femoral   head was removed without difficulty or complication.  Traction was let   off and retractors were placed posterior and anterior around the   acetabulum.      The labrum and foveal tissue were debrided.  I began reaming with a 46 mm   reamer and reamed up to 49 mm reamer with good bony bed preparation and a 50 mm  cup was chosen.  The final 32+4 mm Pinnacle cup was then impacted under fluoroscopy to confirm the depth of penetration and orientation with respect to   Abduction and forward flexion.  A screw was placed into the ilium followed by the hole eliminator.  The final   32+4 neutral Altrex liner was impacted with good  visualized rim fit.  The cup was positioned anatomically within the acetabular portion of the pelvis.      At this point, the femur was rolled to 100 degrees.  Further capsule was   released off the inferior aspect of the femoral neck.  I then   released the superior capsule proximally.  With the leg in a neutral position the hook was placed laterally   along the femur under the vastus lateralis origin and elevated manually and then held in position using the hook attachment on the bed.  The leg was then extended and adducted with the leg rolled to 100   degrees of external rotation.  Retractors were placed along the medial calcar and posteriorly over the greater trochanter.  Once the proximal femur was fully   exposed, I used a box osteotome to set orientation.  I then began   broaching with the starting chili pepper broach and passed this by hand and then broached up to 7.  With the 7 broach in place I chose a high offset neck and did several trial reductions.  The offset was appropriate, leg lengths   appeared to be equal best matched with the +1 head ball trial confirmed radiographically.   Given these findings, I went ahead and dislocated the hip, repositioned all   retractors and positioned the right hip in the extended and abducted position.  The final 7 Hi Actis stem was   chosen and it was impacted down to the level of neck cut.  Based on this   and the trial reductions, a final 32+1 Articuleze metal head ball was chosen and   impacted onto a clean and dry trunnion, and the hip was reduced.  The   hip had been irrigated throughout the case again at this point.  I did   reapproximate the superior capsular leaflet to the anterior leaflet   using #1 Vicryl.  The fascia of the   tensor fascia lata muscle was then reapproximated using #1 Vicryl and #0 Stratafix sutures.  The   remaining wound was closed with 2-0 Vicryl and running 4-0 Monocryl.   The hip was cleaned, dried, and dressed  sterilely using Dermabond and   Aquacel dressing.  The patient was then brought   to recovery room in stable condition tolerating the procedure well.  Rosalene Billings, PA-c was present for the entirety of the case involved from   preoperative positioning, perioperative retractor management, general   facilitation of the case, as well as primary wound closure as assistant.            Madlyn Frankel Charlann Boxer, M.D.        02/25/2023 10:13 AM

## 2023-02-25 NOTE — Plan of Care (Signed)
  Problem: Education: Goal: Knowledge of the prescribed therapeutic regimen will improve Outcome: Progressing   Problem: Activity: Goal: Ability to avoid complications of mobility impairment will improve Outcome: Progressing   Problem: Pain Management: Goal: Pain level will decrease with appropriate interventions Outcome: Progressing   Problem: Education: Goal: Knowledge of General Education information will improve Description: Including pain rating scale, medication(s)/side effects and non-pharmacologic comfort measures Outcome: Progressing   Problem: Activity: Goal: Risk for activity intolerance will decrease Outcome: Progressing   Problem: Nutrition: Goal: Adequate nutrition will be maintained Outcome: Progressing   Problem: Elimination: Goal: Will not experience complications related to bowel motility Outcome: Progressing   Problem: Pain Managment: Goal: General experience of comfort will improve Outcome: Progressing   Problem: Safety: Goal: Ability to remain free from injury will improve Outcome: Progressing   

## 2023-02-26 ENCOUNTER — Encounter (HOSPITAL_COMMUNITY): Payer: Self-pay | Admitting: Orthopedic Surgery

## 2023-02-26 DIAGNOSIS — Z87891 Personal history of nicotine dependence: Secondary | ICD-10-CM | POA: Diagnosis not present

## 2023-02-26 DIAGNOSIS — M1612 Unilateral primary osteoarthritis, left hip: Secondary | ICD-10-CM | POA: Diagnosis not present

## 2023-02-26 LAB — BASIC METABOLIC PANEL
Anion gap: 7 (ref 5–15)
BUN: 18 mg/dL (ref 8–23)
CO2: 25 mmol/L (ref 22–32)
Calcium: 8.2 mg/dL — ABNORMAL LOW (ref 8.9–10.3)
Chloride: 106 mmol/L (ref 98–111)
Creatinine, Ser: 0.9 mg/dL (ref 0.44–1.00)
GFR, Estimated: >60 mL/min (ref >60)
Glucose, Bld: 127 mg/dL — ABNORMAL HIGH (ref 70–99)
Potassium: 4.9 mmol/L (ref 3.5–5.1)
Sodium: 138 mmol/L (ref 135–145)

## 2023-02-26 LAB — CBC
HCT: 31 % — ABNORMAL LOW (ref 36.0–46.0)
Hemoglobin: 9.9 g/dL — ABNORMAL LOW (ref 12.0–15.0)
MCH: 28.8 pg (ref 26.0–34.0)
MCHC: 31.9 g/dL (ref 30.0–36.0)
MCV: 90.1 fL (ref 80.0–100.0)
Platelets: 233 10*3/uL (ref 150–400)
RBC: 3.44 MIL/uL — ABNORMAL LOW (ref 3.87–5.11)
RDW: 13.4 % (ref 11.5–15.5)
WBC: 13 10*3/uL — ABNORMAL HIGH (ref 4.0–10.5)
nRBC: 0 % (ref 0.0–0.2)

## 2023-02-26 MED ORDER — POLYETHYLENE GLYCOL 3350 17 G PO PACK: 17.0000 g | PACK | Freq: Two times a day (BID) | ORAL | 0 refills | Status: AC

## 2023-02-26 MED ORDER — SENNA 8.6 MG PO TABS: 2.0000 | ORAL_TABLET | Freq: Every day | ORAL | 0 refills | Status: AC

## 2023-02-26 MED ORDER — METHOCARBAMOL 500 MG PO TABS: 500.0000 mg | ORAL_TABLET | Freq: Four times a day (QID) | ORAL | 2 refills | Status: AC | PRN

## 2023-02-26 MED ORDER — OXYCODONE HCL 5 MG PO TABS: 2.5000 mg | ORAL_TABLET | ORAL | 0 refills | Status: AC | PRN

## 2023-02-26 MED ORDER — ASPIRIN 81 MG PO CHEW: 81.0000 mg | CHEWABLE_TABLET | Freq: Two times a day (BID) | ORAL | 0 refills | Status: AC

## 2023-02-26 NOTE — TOC Transition Note (Signed)
Transition of Care [facility]) - CM/SW Discharge Note  Patient Details  Name: Chelsea Morales MRN: [id number] Date of Birth: 06-15-1941  [facility] of [facility] CM/SW Contact:  Ewing Schlein, LCSW Phone Number: 02/26/2023, 10:24 AM  Clinical Narrative: Patient is expected to discharge home after working with PT. CSW met with patient to review discharge plan and needs. Patient will go home with a home exercise program (HEP). Patient will need a rolling walker, which [employer] delivered to patient's room. TOC signing off.    Final next level of care: Home/Self Care Barriers to Discharge: No Barriers Identified  Patient Goals and CMS Choice CMS Medicare.gov Compare Post Acute Care list provided to:: Patient Choice offered to / list presented to : Patient  Discharge Plan and Services Additional resources added to the After Visit Summary for      DME Arranged: Walker rolling DME Agency: [employer] Representative spoke with at DME Agency: Prearranged in orthopedist's office  Social Determinants of Health (SDOH) Interventions SDOH Screenings   Food Insecurity: No Food Insecurity (02/25/2023)  Housing: Low Risk  (02/25/2023)  Transportation Needs: No Transportation Needs (02/25/2023)  Utilities: Not At Risk (02/25/2023)  Tobacco Use: Medium Risk (02/25/2023)   Readmission Risk Interventions     No data to display

## 2023-02-26 NOTE — Plan of Care (Signed)
  Problem: Activity: Goal: Ability to avoid complications of mobility impairment will improve Outcome: Progressing Goal: Ability to tolerate increased activity will improve Outcome: Progressing   Problem: Pain Management: Goal: Pain level will decrease with appropriate interventions Outcome: Progressing   Problem: Safety: Goal: Ability to remain free from injury will improve Outcome: Progressing   

## 2023-02-26 NOTE — Progress Notes (Signed)
Discharge package printed and instructions given to pt. Verbalizes understanding. 

## 2023-02-26 NOTE — Evaluation (Signed)
Physical Therapy Evaluation Patient Details Name: Chelsea Morales MRN: [id number] DOB: 1941-07-31 Today's Date: 02/26/2023  History of Present Illness  82 yo female s/p L THA-DA 02/25/23.  Clinical Impression  On eval, pt was Min A for mobility. She walked ~75 feet with a RW. Moderate pain with activity. Will plan to have a 2nd session prior to potential dc home later today.       Recommendations for follow up therapy are one component of a multi-disciplinary discharge planning process, led by the attending physician.  Recommendations may be updated based on patient status, additional functional criteria and insurance authorization.  Follow Up Recommendations       Assistance Recommended at Discharge Intermittent Supervision/Assistance  Patient can return home with the following  A little help with walking and/or transfers;A little help with bathing/dressing/bathroom;Assistance with cooking/housework;Assist for transportation;Help with stairs or ramp for entrance    Equipment Recommendations Rolling walker (2 wheels)  Recommendations for Other Services       Functional Status Assessment Patient has had a recent decline in their functional status and demonstrates the ability to make significant improvements in function in a reasonable and predictable amount of time.     Precautions / Restrictions Precautions Precautions: Fall Restrictions Weight Bearing Restrictions: No Other Position/Activity Restrictions: WBAT      Mobility  Bed Mobility Overal bed mobility: Needs Assistance Bed Mobility: Supine to Sit     Supine to sit: Min assist, HOB elevated     General bed mobility comments: Small amount of assist for L LE. Practiced using gait belt. Cues provided.    Transfers Overall transfer level: Needs assistance Equipment used: Rolling walker (2 wheels) Transfers: Sit to/from Stand Sit to Stand: Min guard, From elevated surface           General transfer  comment: Min guard A for safety. Cues for safety, technique, hand placement.    Ambulation/Gait Ambulation/Gait assistance: Min guard Gait Distance (Feet): 75 Feet Assistive device: Rolling walker (2 wheels) Gait Pattern/deviations: Step-through pattern, Decreased stride length       General Gait Details: Min guard A for mobility. Cues for safety, sequencing, posture, step length. Pt progressed to reciprocal gait pattern as distance increased.  Stairs            Wheelchair Mobility    Modified Rankin (Stroke Patients Only)       Balance Overall balance assessment: Needs assistance         Standing balance support: Reliant on assistive device for balance, During functional activity Standing balance-Leahy Scale: Fair                               Pertinent Vitals/Pain Pain Assessment Pain Assessment: 0-10 Pain Score: 7  Pain Location: L hip/thigh with activity Pain Descriptors / Indicators: Discomfort, Sore Pain Intervention(s): Limited activity within patient's tolerance, Monitored during session, Ice applied, Repositioned    Home Living Family/patient expects to be discharged to:: Private residence Living Arrangements: Spouse/significant other Available Help at Discharge: Family Type of Home: House Home Access: Stairs to enter Entrance Stairs-Rails: None Entrance Stairs-Number of Steps: 1   Home Layout: One level Home Equipment: Cane - single point;Toilet riser;Shower seat      Prior Function Prior Level of Function : Independent/Modified Independent                     Hand Dominance  Extremity/Trunk Assessment   Upper Extremity Assessment Upper Extremity Assessment: Overall WFL for tasks assessed    Lower Extremity Assessment Lower Extremity Assessment: Generalized weakness    Cervical / Trunk Assessment Cervical / Trunk Assessment: Normal  Communication   Communication: HOH  Cognition Arousal/Alertness:  Awake/alert Behavior During Therapy: WFL for tasks assessed/performed Overall Cognitive Status: Within Functional Limits for tasks assessed                                          General Comments      Exercises Total Joint Exercises Ankle Circles/Pumps: AROM, Both, 10 reps Quad Sets: AROM, Both, 10 reps Heel Slides: AAROM, Left, 10 reps Hip ABduction/ADduction: AAROM, Left, 10 reps   Assessment/Plan    PT Assessment Patient needs continued PT services  PT Problem List Decreased strength;Decreased range of motion;Decreased activity tolerance;Decreased balance;Decreased mobility;Decreased knowledge of use of DME;Pain       PT Treatment Interventions DME instruction;Gait training;Therapeutic exercise;Balance training;Stair training;Functional mobility training;Therapeutic activities;Patient/family education    PT Goals (Current goals can be found in the Care Plan section)  Acute Rehab PT Goals Patient Stated Goal: less pain. regain PLOF/independence PT Goal Formulation: With patient Time For Goal Achievement: 03/12/23 Potential to Achieve Goals: Good    Frequency 7X/week     Co-evaluation               AM-PAC PT "6 Clicks" Mobility  Outcome Measure Help needed turning from your back to your side while in a flat bed without using bedrails?: A Little Help needed moving from lying on your back to sitting on the side of a flat bed without using bedrails?: A Little Help needed moving to and from a bed to a chair (including a wheelchair)?: A Little Help needed standing up from a chair using your arms (e.g., wheelchair or bedside chair)?: A Little Help needed to walk in hospital room?: A Little Help needed climbing 3-5 steps with a railing? : A Little 6 Click Score: 18    End of Session Equipment Utilized During Treatment: Gait belt Activity Tolerance: Patient tolerated treatment well Patient left: in chair;with call bell/phone within reach   PT  Visit Diagnosis: Other abnormalities of gait and mobility (R26.89);Pain Pain - Right/Left: Left Pain - part of body: Hip    Time: 1610-9604 PT Time Calculation (min) (ACUTE ONLY): 26 min   Charges:   PT Evaluation $PT Eval Low Complexity: 1 Low PT Treatments $Gait Training: 8-22 mins           Faye Ramsay, PT Acute Rehabilitation  Office: [phone]

## 2023-02-26 NOTE — Progress Notes (Signed)
   Subjective: 1 Day Post-Op Procedure(s) (LRB): TOTAL HIP ARTHROPLASTY ANTERIOR APPROACH (Left) Patient reports pain as mild.   Patient seen in rounds by Dr. Charlann Boxer. Patient is resting in bed on exam this morning. No acute events overnight. Foley catheter removed. Patient has not been up with PT yet.  We will start therapy today.   Objective: Vital signs in last 24 hours: Temp:  [97.1 F (36.2 C)-98.5 F (36.9 C)] 98.1 F (36.7 C) (05/22 0559) Pulse Rate:  [59-101] 89 (05/22 0559) Resp:  [10-18] 16 (05/22 0559) BP: (111-150)/(47-76) 121/63 (05/22 0559) SpO2:  [94 %-100 %] 98 % (05/22 0559) Weight:  [48.2 kg] 48.2 kg (05/21 0920)  Intake/Output from previous day:  Intake/Output Summary (Last 24 hours) at 02/26/2023 0722 Last data filed at 02/26/2023 1610 Gross per 24 hour  Intake 2935.42 ml  Output 920 ml  Net 2015.42 ml     Intake/Output this shift: No intake/output data recorded.  Labs: Recent Labs    02/26/23 0358  HGB 9.9*   Recent Labs    02/26/23 0358  WBC 13.0*  RBC 3.44*  HCT 31.0*  PLT 233   Recent Labs    02/26/23 0358  NA 138  K 4.9  CL 106  CO2 25  BUN 18  CREATININE 0.90  GLUCOSE 127*  CALCIUM 8.2*   No results for input(s): "LABPT", "INR" in the last 72 hours.  Exam: General - Patient is Alert and Oriented Extremity - Neurologically intact Sensation intact distally Intact pulses distally Dorsiflexion/Plantar flexion intact Dressing - dressing C/D/I Motor Function - intact, moving foot and toes well on exam.   Past Medical History:  Diagnosis Date   Anxiety    Arthritis    Bronchiectasis (HCC)    Depression    Hematuria    History of kidney stones    Kidney stones    1985   Pneumonia    Protein calorie malnutrition (HCC)    Ruptured ear drum    childhood   Syncopal episodes 05/2022    Assessment/Plan: 1 Day Post-Op Procedure(s) (LRB): TOTAL HIP ARTHROPLASTY ANTERIOR APPROACH (Left) Principal Problem:   S/P total left  hip arthroplasty  Estimated body mass index is 17.67 kg/m as calculated from the following:   Height as of this encounter: 5\' 5"  (1.651 m).   Weight as of this encounter: 48.2 kg. Advance diet Up with therapy D/C IV fluids  DVT Prophylaxis - Aspirin Weight bearing as tolerated.  Hgb stable at 9.9 this AM.  Plan is to go Home after hospital stay. Plan for discharge today after meeting goals with therapy. Follow up in the office in 2 weeks.   Dennie Bible, PA-C Orthopedic Surgery [phone] 02/26/2023, 7:22 AM

## 2023-03-10 NOTE — Discharge Summary (Signed)
Patient ID: Chelsea Morales MRN: [id number] DOB/AGE: 1940/11/30 82 y.o.  Admit date: 02/25/2023 Discharge date: 02/26/2023  Admission Diagnoses:  Left hip osteoarthritis  Discharge Diagnoses:  Principal Problem:   S/P total left hip arthroplasty   Past Medical History:  Diagnosis Date   Anxiety    Arthritis    Bronchiectasis (HCC)    Depression    Hematuria    History of kidney stones    Kidney stones    1985   Pneumonia    Protein calorie malnutrition (HCC)    Ruptured ear drum    childhood   Syncopal episodes 05/2022    Surgeries: Procedure(s): TOTAL HIP ARTHROPLASTY ANTERIOR APPROACH on 02/25/2023   Consultants:   Discharged Condition: Improved  Hospital Course: Chelsea Morales is an 82 y.o. female who was admitted 02/25/2023 for operative treatment ofS/P total left hip arthroplasty. Patient has severe unremitting pain that affects sleep, daily activities, and work/hobbies. After pre-op clearance the patient was taken to the operating room on 02/25/2023 and underwent  Procedure(s): TOTAL HIP ARTHROPLASTY ANTERIOR APPROACH.    Patient was given perioperative antibiotics:  Anti-infectives (From admission, onward)    Start     Dose/Rate Route Frequency Ordered Stop   02/25/23 1800  ceFAZolin (ANCEF) IVPB 2g/100 mL premix        2 g 200 mL/hr over 30 Minutes Intravenous Every 6 hours 02/25/23 1527 02/25/23 2350   02/25/23 0900  ceFAZolin (ANCEF) IVPB 2g/100 mL premix        2 g 200 mL/hr over 30 Minutes Intravenous On call to O.R. 02/25/23 0859 02/25/23 1207        Patient was given sequential compression devices, early ambulation, and chemoprophylaxis to prevent DVT. Patient worked with PT and was meeting their goals regarding safe ambulation and transfers.  Patient benefited maximally from hospital stay and there were no complications.    Recent vital signs: No data found.   Recent laboratory studies: No results for input(s): "WBC", "HGB", "HCT",  "PLT", "NA", "K", "CL", "CO2", "BUN", "CREATININE", "GLUCOSE", "INR", "CALCIUM" in the last 72 hours.  Invalid input(s): "PT", "2"   Discharge Medications:   Allergies as of 02/26/2023       Reactions   Sulfa Antibiotics Anaphylaxis   Sulfamethoxazole-trimethoprim    Other reaction(s): hives throat swell        Medication List     TAKE these medications    aspirin 81 MG chewable tablet Chew 1 tablet (81 mg total) by mouth 2 (two) times daily for 28 days.   calcium-vitamin D 250-100 MG-UNIT tablet Take 2 tablets by mouth 2 (two) times daily.   Flutter Devi Use as directed   melatonin 5 MG Tabs Take 5 mg by mouth at bedtime as needed (for sleep).   methocarbamol 500 MG tablet Commonly known as: ROBAXIN Take 1 tablet (500 mg total) by mouth every 6 (six) hours as needed for muscle spasms.   oxyCODONE 5 MG immediate release tablet Commonly known as: Oxy IR/ROXICODONE Take 0.5-1 tablets (2.5-5 mg total) by mouth every 4 (four) hours as needed for severe pain.   polyethylene glycol 17 g packet Commonly known as: MIRALAX / GLYCOLAX Take 17 g by mouth 2 (two) times daily.   senna 8.6 MG Tabs tablet Commonly known as: SENOKOT Take 2 tablets (17.2 mg total) by mouth at bedtime for 14 days.   traZODone 50 MG tablet Commonly known as: DESYREL Take 50 mg by mouth at bedtime.  Discharge Care Instructions  (From admission, onward)           Start     Ordered   02/26/23 0000  Change dressing       Comments: Maintain surgical dressing until follow up in the clinic. If the edges start to pull up, may reinforce with tape. If the dressing is no longer working, may remove and cover with gauze and tape, but must keep the area dry and clean.  Call with any questions or concerns.   02/26/23 0724            Diagnostic Studies: DG Pelvis Portable  Result Date: 02/25/2023 CLINICAL DATA:  [id number] S/P total hip arthroplasty [id number] EXAM: PORTABLE PELVIS  1-2 VIEWS COMPARISON:  None Available. FINDINGS: Postsurgical changes of left total hip arthroplasty. Normal alignment. No evidence of loosening or periprosthetic fracture. Expected soft tissue changes. IMPRESSION: Postsurgical changes of left total hip arthroplasty. Normal alignment. No evidence of immediate hardware complication. Electronically Signed   By: Caprice Renshaw M.D.   On: 02/25/2023 13:49   DG HIP UNILAT WITH PELVIS 1V LEFT  Result Date: 02/25/2023 CLINICAL DATA:  Hip replacement EXAM: Intraoperative fluoroscopy COMPARISON:  None Available. FINDINGS: Eleven fluoroscopic spot images submitted for review demonstrate placement of hip arthroplasty with a screw fixated acetabular cup and Press-Fit femoral stem. Imaging was obtained to aid in treatment. Please correlate with real-time fluoroscopy of 0.2 minutes. Cumulative dose 0.6828 mGy IMPRESSION: Intraoperative fluoroscopy Electronically Signed   By: Karen Kays M.D.   On: 02/25/2023 12:42   DG C-Arm 1-60 Min-No Report  Result Date: 02/25/2023 Fluoroscopy was utilized by the requesting physician.  No radiographic interpretation.    Disposition: Discharge disposition: 01-Home or Self Care       Discharge Instructions     Call MD / Call 911   Complete by: As directed    If you experience chest pain or shortness of breath, CALL 911 and be transported to the hospital emergency room.  If you develope a fever above 101 F, pus (white drainage) or increased drainage or redness at the wound, or calf pain, call your surgeon's office.   Change dressing   Complete by: As directed    Maintain surgical dressing until follow up in the clinic. If the edges start to pull up, may reinforce with tape. If the dressing is no longer working, may remove and cover with gauze and tape, but must keep the area dry and clean.  Call with any questions or concerns.   Constipation Prevention   Complete by: As directed    Drink plenty of fluids.  Prune juice may  be helpful.  You may use a stool softener, such as Colace (over the counter) 100 mg twice a day.  Use MiraLax (over the counter) for constipation as needed.   Diet - low sodium heart healthy   Complete by: As directed    Increase activity slowly as tolerated   Complete by: As directed    Weight bearing as tolerated with assist device (walker, cane, etc) as directed, use it as long as suggested by your surgeon or therapist, typically at least 4-6 weeks.   Post-operative opioid taper instructions:   Complete by: As directed    POST-OPERATIVE OPIOID TAPER INSTRUCTIONS: It is important to wean off of your opioid medication as soon as possible. If you do not need pain medication after your surgery it is ok to stop day one. Opioids include: Codeine, Hydrocodone(Norco, Vicodin), Oxycodone(Percocet, oxycontin)  and hydromorphone amongst others.  Long term and even short term use of opiods can cause: Increased pain response Dependence Constipation Depression Respiratory depression And more.  Withdrawal symptoms can include Flu like symptoms Nausea, vomiting And more Techniques to manage these symptoms Hydrate well Eat regular healthy meals Stay active Use relaxation techniques(deep breathing, meditating, yoga) Do Not substitute Alcohol to help with tapering If you have been on opioids for less than two weeks and do not have pain than it is ok to stop all together.  Plan to wean off of opioids This plan should start within one week post op of your joint replacement. Maintain the same interval or time between taking each dose and first decrease the dose.  Cut the total daily intake of opioids by one tablet each day Next start to increase the time between doses. The last dose that should be eliminated is the evening dose.      TED hose   Complete by: As directed    Use stockings (TED hose) for 2 weeks on both leg(s).  You may remove them at night for sleeping.        Follow-up  Information     Durene Romans, MD. Go on 03/12/2023.   Specialty: Orthopedic Surgery Why: You are scheduled for first post op appt on Wednesday June 5 at 3:30pm. Contact information: [address] [place] [address] [phone]                  Signed: Cassandria Anger 03/10/2023, 7:20 AM

## 2023-04-14 ENCOUNTER — Other Ambulatory Visit: Payer: Self-pay | Admitting: Family Medicine

## 2023-04-14 DIAGNOSIS — Z1231 Encounter for screening mammogram for malignant neoplasm of breast: Secondary | ICD-10-CM

## 2023-04-17 DIAGNOSIS — Z471 Aftercare following joint replacement surgery: Secondary | ICD-10-CM | POA: Diagnosis not present

## 2023-04-17 DIAGNOSIS — Z96642 Presence of left artificial hip joint: Secondary | ICD-10-CM | POA: Diagnosis not present

## 2023-04-21 DIAGNOSIS — H903 Sensorineural hearing loss, bilateral: Secondary | ICD-10-CM | POA: Diagnosis not present

## 2023-05-20 ENCOUNTER — Ambulatory Visit
Admission: RE | Admit: 2023-05-20 | Discharge: 2023-05-20 | Disposition: A | Discharge status: Home / Self Care | Payer: Medicare PPO | Source: Ambulatory Visit | Attending: Family Medicine | Admitting: Family Medicine

## 2023-05-20 DIAGNOSIS — Z1231 Encounter for screening mammogram for malignant neoplasm of breast: Secondary | ICD-10-CM | POA: Diagnosis not present

## 2023-06-16 DIAGNOSIS — Z96642 Presence of left artificial hip joint: Secondary | ICD-10-CM | POA: Diagnosis not present

## 2023-06-16 DIAGNOSIS — Z471 Aftercare following joint replacement surgery: Secondary | ICD-10-CM | POA: Diagnosis not present

## 2023-06-24 DIAGNOSIS — Z96642 Presence of left artificial hip joint: Secondary | ICD-10-CM | POA: Diagnosis not present

## 2023-06-24 DIAGNOSIS — M6281 Muscle weakness (generalized): Secondary | ICD-10-CM | POA: Diagnosis not present

## 2023-07-02 DIAGNOSIS — M6281 Muscle weakness (generalized): Secondary | ICD-10-CM | POA: Diagnosis not present

## 2023-07-02 DIAGNOSIS — Z96642 Presence of left artificial hip joint: Secondary | ICD-10-CM | POA: Diagnosis not present

## 2023-07-10 DIAGNOSIS — M6281 Muscle weakness (generalized): Secondary | ICD-10-CM | POA: Diagnosis not present

## 2023-07-10 DIAGNOSIS — Z96642 Presence of left artificial hip joint: Secondary | ICD-10-CM | POA: Diagnosis not present

## 2023-07-15 DIAGNOSIS — M810 Age-related osteoporosis without current pathological fracture: Secondary | ICD-10-CM | POA: Diagnosis not present

## 2023-07-15 DIAGNOSIS — Z23 Encounter for immunization: Secondary | ICD-10-CM | POA: Diagnosis not present

## 2023-07-15 DIAGNOSIS — E7800 Pure hypercholesterolemia, unspecified: Secondary | ICD-10-CM | POA: Diagnosis not present

## 2023-07-15 DIAGNOSIS — F5101 Primary insomnia: Secondary | ICD-10-CM | POA: Diagnosis not present

## 2023-07-15 DIAGNOSIS — E441 Mild protein-calorie malnutrition: Secondary | ICD-10-CM | POA: Diagnosis not present

## 2023-07-15 DIAGNOSIS — Z1331 Encounter for screening for depression: Secondary | ICD-10-CM | POA: Diagnosis not present

## 2023-07-15 DIAGNOSIS — Z0000 Encounter for general adult medical examination without abnormal findings: Secondary | ICD-10-CM | POA: Diagnosis not present

## 2023-07-15 DIAGNOSIS — J479 Bronchiectasis, uncomplicated: Secondary | ICD-10-CM | POA: Diagnosis not present

## 2023-07-16 ENCOUNTER — Other Ambulatory Visit: Payer: Self-pay | Admitting: Family Medicine

## 2023-07-16 DIAGNOSIS — E2839 Other primary ovarian failure: Secondary | ICD-10-CM

## 2023-07-17 DIAGNOSIS — Z96642 Presence of left artificial hip joint: Secondary | ICD-10-CM | POA: Diagnosis not present

## 2023-07-17 DIAGNOSIS — M6281 Muscle weakness (generalized): Secondary | ICD-10-CM | POA: Diagnosis not present

## 2023-07-21 DIAGNOSIS — E875 Hyperkalemia: Secondary | ICD-10-CM | POA: Diagnosis not present

## 2023-07-22 DIAGNOSIS — M6281 Muscle weakness (generalized): Secondary | ICD-10-CM | POA: Diagnosis not present

## 2023-07-22 DIAGNOSIS — Z96642 Presence of left artificial hip joint: Secondary | ICD-10-CM | POA: Diagnosis not present

## 2023-08-19 DIAGNOSIS — L814 Other melanin hyperpigmentation: Secondary | ICD-10-CM | POA: Diagnosis not present

## 2023-08-19 DIAGNOSIS — L219 Seborrheic dermatitis, unspecified: Secondary | ICD-10-CM | POA: Diagnosis not present

## 2023-08-19 DIAGNOSIS — D3612 Benign neoplasm of peripheral nerves and autonomic nervous system, upper limb, including shoulder: Secondary | ICD-10-CM | POA: Diagnosis not present

## 2023-08-19 DIAGNOSIS — L578 Other skin changes due to chronic exposure to nonionizing radiation: Secondary | ICD-10-CM | POA: Diagnosis not present

## 2023-08-19 DIAGNOSIS — D485 Neoplasm of uncertain behavior of skin: Secondary | ICD-10-CM | POA: Diagnosis not present

## 2023-08-19 DIAGNOSIS — L821 Other seborrheic keratosis: Secondary | ICD-10-CM | POA: Diagnosis not present

## 2023-08-19 DIAGNOSIS — L570 Actinic keratosis: Secondary | ICD-10-CM | POA: Diagnosis not present

## 2023-08-19 DIAGNOSIS — M6740 Ganglion, unspecified site: Secondary | ICD-10-CM | POA: Diagnosis not present

## 2023-08-19 DIAGNOSIS — D2272 Melanocytic nevi of left lower limb, including hip: Secondary | ICD-10-CM | POA: Diagnosis not present

## 2023-08-25 DIAGNOSIS — E875 Hyperkalemia: Secondary | ICD-10-CM | POA: Diagnosis not present

## 2023-09-02 DIAGNOSIS — E875 Hyperkalemia: Secondary | ICD-10-CM | POA: Diagnosis not present

## 2023-09-25 DIAGNOSIS — L820 Inflamed seborrheic keratosis: Secondary | ICD-10-CM | POA: Diagnosis not present

## 2023-09-25 DIAGNOSIS — Z8619 Personal history of other infectious and parasitic diseases: Secondary | ICD-10-CM | POA: Diagnosis not present

## 2023-09-25 DIAGNOSIS — L821 Other seborrheic keratosis: Secondary | ICD-10-CM | POA: Diagnosis not present

## 2023-12-24 DIAGNOSIS — D485 Neoplasm of uncertain behavior of skin: Secondary | ICD-10-CM | POA: Diagnosis not present

## 2023-12-24 DIAGNOSIS — W57XXXA Bitten or stung by nonvenomous insect and other nonvenomous arthropods, initial encounter: Secondary | ICD-10-CM | POA: Diagnosis not present

## 2023-12-24 DIAGNOSIS — L988 Other specified disorders of the skin and subcutaneous tissue: Secondary | ICD-10-CM | POA: Diagnosis not present

## 2023-12-24 DIAGNOSIS — L821 Other seborrheic keratosis: Secondary | ICD-10-CM | POA: Diagnosis not present

## 2023-12-24 DIAGNOSIS — L249 Irritant contact dermatitis, unspecified cause: Secondary | ICD-10-CM | POA: Diagnosis not present

## 2023-12-24 DIAGNOSIS — C44319 Basal cell carcinoma of skin of other parts of face: Secondary | ICD-10-CM | POA: Diagnosis not present

## 2023-12-31 DIAGNOSIS — Z961 Presence of intraocular lens: Secondary | ICD-10-CM | POA: Diagnosis not present

## 2023-12-31 DIAGNOSIS — H524 Presbyopia: Secondary | ICD-10-CM | POA: Diagnosis not present

## 2023-12-31 DIAGNOSIS — H26492 Other secondary cataract, left eye: Secondary | ICD-10-CM | POA: Diagnosis not present

## 2024-01-20 ENCOUNTER — Ambulatory Visit
Admission: RE | Admit: 2024-01-20 | Discharge: 2024-01-20 | Disposition: A | Discharge status: Home / Self Care | Payer: Medicare PPO | Source: Ambulatory Visit | Attending: Family Medicine | Admitting: Family Medicine

## 2024-01-20 DIAGNOSIS — M810 Age-related osteoporosis without current pathological fracture: Secondary | ICD-10-CM | POA: Diagnosis not present

## 2024-01-20 DIAGNOSIS — E2839 Other primary ovarian failure: Secondary | ICD-10-CM

## 2024-01-27 DIAGNOSIS — H26492 Other secondary cataract, left eye: Secondary | ICD-10-CM | POA: Diagnosis not present

## 2024-02-02 DIAGNOSIS — C44319 Basal cell carcinoma of skin of other parts of face: Secondary | ICD-10-CM | POA: Diagnosis not present

## 2024-02-02 DIAGNOSIS — D229 Melanocytic nevi, unspecified: Secondary | ICD-10-CM | POA: Diagnosis not present

## 2024-02-04 ENCOUNTER — Other Ambulatory Visit: Payer: Self-pay

## 2024-02-04 DIAGNOSIS — J479 Bronchiectasis, uncomplicated: Secondary | ICD-10-CM

## 2024-02-05 ENCOUNTER — Ambulatory Visit: Admitting: Internal Medicine

## 2024-02-05 ENCOUNTER — Encounter: Payer: Self-pay | Admitting: Internal Medicine

## 2024-02-05 VITALS — BP 146/77 | HR 77 | Ht 65.0 in | Wt 102.2 lb

## 2024-02-05 DIAGNOSIS — Z87891 Personal history of nicotine dependence: Secondary | ICD-10-CM | POA: Diagnosis not present

## 2024-02-05 DIAGNOSIS — Z7185 Encounter for immunization safety counseling: Secondary | ICD-10-CM | POA: Diagnosis not present

## 2024-02-05 DIAGNOSIS — Z1211 Encounter for screening for malignant neoplasm of colon: Secondary | ICD-10-CM | POA: Diagnosis not present

## 2024-02-05 DIAGNOSIS — J479 Bronchiectasis, uncomplicated: Secondary | ICD-10-CM | POA: Diagnosis not present

## 2024-02-05 LAB — PULMONARY FUNCTION TEST
DL/VA % pred: 88 %
DL/VA: 3.54 ml/min/mmHg/L
DLCO unc % pred: 80 %
DLCO unc: 15.53 ml/min/mmHg
FEF 25-75 Pre: 1.02 L/s
FEF2575-%Pred-Pre: 80 %
FEV1-%Pred-Pre: 93 %
FEV1-Pre: 1.77 L
FEV1FVC-%Pred-Pre: 94 %
FEV6-%Pred-Pre: 104 %
FEV6-Pre: 2.53 L
FEV6FVC-%Pred-Pre: 104 %
FVC-%Pred-Pre: 100 %
FVC-Pre: 2.58 L
Pre FEV1/FVC ratio: 69 %
Pre FEV6/FVC Ratio: 98 %

## 2024-02-05 MED ORDER — SODIUM CHLORIDE 3 % IN NEBU: INHALATION_SOLUTION | RESPIRATORY_TRACT | 12 refills | Status: AC | PRN

## 2024-02-05 NOTE — Patient Instructions (Signed)
Spiro and DLCO performed today. 

## 2024-02-05 NOTE — Patient Instructions (Addendum)
 ICD-10-CM   1. Bronchiectasis without complication (HCC)  J47.9          Bronchiectasis is clinically stable but having symptoms of mucus at night.  Mild symptom burden and tolerating it well with flutter valve Last chest x-ray approximately 1-2 years ago and last CT scan several years ago Pulmonary function test stable  Plan  -Get diligent about do flutter valve 5 times each time and at least 5-10 times daily -Start 3 mL 3% hypertonic saline nebulized daily at night to see if it will give you symptom relief -No indication for another imaging right now - Get respiratory vaccines in the fall season - Please talk to PCP Faustina Hood, MD -  and ensure you get  shingrix (GSK) inactivated vaccine against shingles   Follow-up -12 months for routine follow-up but after breathing test 15-minute slot

## 2024-02-05 NOTE — Progress Notes (Signed)
Spiro and DLCO performed today. 

## 2024-02-05 NOTE — Progress Notes (Signed)
 PI PCP Arch Ko, MD   HPI   OV 12/14/2015  Chief Complaint  Patient presents with   Pulmonary Consult    Pt changing providers from MW to MR. Pt states her breathing has not changed since last OV in 07/2015 with MW. Pt c/o prod cough with yellow mucus. Pt denies SOB and CP/tightness.     83 year old female. Transfer of care from Dr. Waymond Hailey to myself Dr. Bertrum Brodie for bronchiectasis.   from [place] but has lived in [place] for 40 years. Her daughter moved to [place]  and in 2014 she visited her and suffered pneumonia. Prior to that she had no respiratory problems. Following recovery from the pneumonia she's had chronic cough which still persists without change. The cough is better when she is sitting erect but when she lies down she has several episodes and brings out at least 3-5 tissue full of yellow sputum. In the last 3 years she's had 3 course of antibiotics/prednisone . This as best that she can recollect. There are no new admissions. She has lost some weight recently but this is intentional following walking. She does not get dyspneic or wheeze at any time including during walking. Overall symptom burden is mild but she finds the cough and sputum disgusting and she wants to understand why.  May 2016 CT scan of the chest personally visualized shows right middle lobe and lingular bronchiectasis with some bronchiectasis in the left lower lobe.  Pulmonary function test to determine to be normal per chart review.   OV 02/29/2016  Chief Complaint  Patient presents with   Follow-up    Review Bronch results. denies any current breathing issues today.     Follow-up right middle lobe and lingular bronchiectasis. She underwent bronchoscopy with lavage 02/07/2016. Results showed Pseudomonas fluorescence. She is now on day 13 out of 14 of high-dose ciprofloxacin . With this the cough and sputum production that her chronic have resolved. She feels she's  having some dry mouth/oral sores with the ciprofloxacin . Otherwise feels good. We discussed the results.  some concerns about blood pressure. But the blood pressure was checked with her shirt on. We rechecked it and the systolic was in the 100s and diastolic 60s     OV 09/05/2016  Chief Complaint  Patient presents with   Follow-up    28mo rov. pt states breathing is doing well. pt c/o mild prod cough with white to yellow mucus. pt denies any sob, wheezing or chest dicomfort.    Follow-up right middle lobe and lingular bronchiectasis. She underwent bronchoscopy with lavage 02/07/2016. Results showed Pseudomonas fluorescence  She is following up after May 2017. In the interim she's only had very mild cough. Cough is rated at level II out of 10. It is mostly at night. The sputum is clear and thin. She uses the flutter valve only occasionally when he does help. She does not use any inhalers. She does not feel like she needs any inhalers for her level of symptomatology. She has no dyspnea or chest tightness or wheezing. She is not interested in inhaler therapy. She is open to trying Mucinex. She will have a flu shot today.   OV 06/05/2017  Chief Complaint  Patient presents with   Follow-up    Pt has presistant productive cough with yellow thick mucus each night for last few months. Pt is having trouble getting to sleep.    Follow-up right middle lobe and lingular bronchiectasis. Status post bronchoscopy with lavage May 2017  with growth of Pseudomonas fluorescence. Normal lung function in October 2016. She is only on as needed symptomatically therapy but mostly on observation only therapy    Last visit November 2017. After that she's been doing well. For the last 3 or 4 months she's had cough and mucus at night that is slightly increased compared to baseline. She says that she coughs it out mucus is clear but at the end it becomes yellow. It is only small in amount. But it is a definite change  for the worse. Because of this she has some difficulty initiating sleep but then after that she sleeps well. Denies any dyspnea or chest tightness and wheezing or any daytime problems. She is open to trying Mucinex or taking a steam inhalation. But beyond that she does not want to do inhaler therapy or any other medications. She does not feel flareup.    OV 07/27/2018  Subjective:  Patient ID: Chelsea Morales, female , DOB: 06-Dec-1940 , age 61 y.o. , MRN: [id number] , ADDRESS: [address] [place] [address]  Follow-up right middle lobe and lingular bronchiectasis. Status post bronchoscopy with lavage May 2017 with growth of Pseudomonas fluorescence. Normal lung function in October 2016. She is only on as needed symptomatically therapy but mostly on observation only therapy   07/27/2018 -   Chief Complaint  Patient presents with   Follow-up    doing well at this time no problems.     HPI Chelsea Morales 83 y.o. -returns for bronchiectasis.  She prefers to just have supportive care and expectant follow-up.  Last seen over a year ago.  She says that 3 weeks ago she was in [place] and 3 weeks ago [place] was [place] the [place] when she got a sore throat and then started having what sounds like a classic bronchiectasis flare with significant amount of yellow sputum and cough and congestion and wheezing and shortness of breath and chest tightness.  This made her more dyspneic while climbing mountains.  Nevertheless she persisted.  Her symptoms have spontaneously resolved 1 week ago.  For the last 2 days she is now returned to [facility] and is currently jet lag but feeling great.  She feels the sputum production is even better than baseline which is hardly any sputum.  Currently no cough or shortness of breath or wheezing.  She feels great.  She does not feel the need to take daily maintenance inhalers.  She is not taking Mucinex or doing saline nebulizer and a hypertonic format.   She wants to have a flu shot today.    ROS - per HPI   OV 08/02/2020  Subjective:  Patient ID: Chelsea Morales, female , DOB: 1941/08/02 , age 44 y.o. , MRN: [id number] , ADDRESS: [address] [place] [address] PCP Faustina Hood, MD Patient Care Team: Faustina Hood, MD as PCP - General (Family Medicine)  This Provider for this visit: Treatment Team:  Attending Provider: Maire Scot, MD    08/02/2020 -   Chief Complaint  Patient presents with   Follow-up    no complaints, PFT follow up     Follow-up right middle lobe and lingular bronchiectasis. Status post bronchoscopy with lavage May 2017 with growth of Pseudomonas fluorescence. Normal lung function in October 2016. She is only on as needed symptomatically therapy but mostly on observation only therapy. LAst CT May 2016.  Last chest x-ray October 2019     HPI [facility] Chelsea Morales  83 y.o. -presents for annual follow-up but she missed October 2020 because of the COVID-19 pandemic.  She continues to do well.  She has not traveled.  She is social distancing and isolating.  Has not had any exacerbations of bronchiectasis.  She does complain of mild mucus every morning.  She also complains of halitosis ever since masking.  She also has dealt with vertigo and she had earwax cleaned and it went away.  But now she feels it is coming back.  She asked me to check a years and this earwax again.  She is up-to-date with her vaccines.  Last pulmonary function test was today it shows decline in lung function but still adequate.  This 40 cc/year of FEV1 declined which is slightly more than average but still current overall severity is mild/normal.       OV 03/26/2022  Subjective:  Patient ID: Chelsea Morales, female , DOB: 09/12/1941 , age 70 y.o. , MRN: [id number] , ADDRESS: [address] [place] [address] PCP Faustina Hood, MD Patient Care Team: Faustina Hood, MD as PCP - General (Family  Medicine)  This Provider for this visit: Treatment Team:  Attending Provider: Maire Scot, MD    03/26/2022 -   Chief Complaint  Patient presents with   Follow-up    Pt states she has been doing okay since last visit and denies any complaints.     HPI Chelsea Morales 83 y.o. -returns for follow-up after a span of nearly 2 years.  She says overall she is doing well.  A year ago she picked up respiratory infection which she presumes is RSV from being exposed to immigrant kids it was not COVID.  After that she had sputum production for quite a while but she is back at baseline now.  At baseline she got sputum production of white thick sputum that.  A couple of Kleenex tissues at night when she lies down occasionally will be yellow.  But no hemoptysis no other shortness of breath otherwise feeling good.  Last imaging was a few years ago.  She is not taking any Mucinex.  She has a flutter valve but not using it.  Not using nebulizers no vest usage.  She does complain about some nasal congestion at night and having to blow her nose and sometimes she will have some epistaxis.  She is not using any saline nasal Morales for this.   OV 02/05/2024  Subjective:  Patient ID: Chelsea Morales, female , DOB: 09/06/1941 , age 59 y.o. , MRN: [id number] , ADDRESS: [address] [place] [address] PCP Faustina Hood, MD Patient Care Team: Faustina Hood, MD as PCP - General (Family Medicine)  This Provider for this visit: Treatment Team:  Attending Provider: Maire Scot, MD    02/05/2024 -   Chief Complaint  Patient presents with   Follow-up    Bronchiectasis- pt states she is feeling well    Follow-up right middle lobe and lingular bronchiectasis. Status post bronchoscopy with lavage May 2017 with growth of Pseudomonas fluorescence. Normal lung function in October 2016. She is only on as needed symptomatically therapy but mostly on observation only therapy. LAst CT  May 2016.  Last chest x-ray October 2019   Last Weight  Most recent update: 02/05/2024  2:22 PM    Weight  46.4 kg (102 lb 3.2 oz)               HPI Chelsea Morales 83 y.o. -  returns for follow-up of her bronchiectasis with night mucus production.  Have not seen her in almost 2 years.  She says our office did not make an appointment.  She says she is stable.  She did have interim hip replacement on the left side elective a year ago.  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  She has had a DEXA bone scan results are pending.  But otherwise she is doing well.  Otherwise no admissions.  She uses a flutter valve regularly at night.  She brings out 4-5 tissue paper amounts of sputum daily rest of the day and at night she is sleeping well and she is not bothered by sputum.  Only happens at night when she goes to sleep.  She is willing to try hypertonic saline.  She is not too keen on significant testing or inhaler therapy.     PFT - eviewd and normal     Latest Ref Rng & Units 02/05/2024    8:36 AM 10/01/2022    8:49 AM 08/02/2020   10:59 AM 07/28/2015    8:55 AM  PFT Results  FVC-Pre L 2.58  P 2.76  2.80  2.94   FVC-Predicted Pre % 100  P 105  104  101   FVC-Post L  2.79   2.77   FVC-Predicted Post %  107   95   Pre FEV1/FVC % % 69  P 69  69  73   Post FEV1/FCV % %  73   77   FEV1-Pre L 1.77  P 1.90  1.93  2.14   FEV1-Predicted Pre % 93  P 98  96  99   FEV1-Post L  2.04   2.14   DLCO uncorrected ml/min/mmHg 15.53  P 17.09  17.69    DLCO UNC% % 80  P 88  91    DLCO corrected ml/min/mmHg  17.09  17.69    DLCO COR %Predicted %  88  91    DLVA Predicted % 88  P 86  90    TLC L  5.31   5.33   TLC % Predicted %  101   102   RV % Predicted %  96   101     P Preliminary result       LAB RESULTS last 96 hours No results found.       has a past medical history of Anxiety, Arthritis, Bronchiectasis (HCC), Depression, Hematuria, History of kidney stones, Kidney  stones, Pneumonia, Protein calorie malnutrition (HCC), Ruptured ear drum, and Syncopal episodes (05/2022).   reports that she quit smoking about 64 years ago. Her smoking use included cigarettes. She started smoking about 65 years ago. She has a 0.3 pack-year smoking history. She has never used smokeless tobacco.  Past Surgical History:  Procedure Laterality Date   BREAST BIOPSY Right 08/08/2006   COLONOSCOPY     OOPHORECTOMY     TOTAL HIP ARTHROPLASTY Left 02/25/2023   Procedure: TOTAL HIP ARTHROPLASTY ANTERIOR APPROACH;  Surgeon: Claiborne Crew, MD;  Location: [facility];  Service: Orthopedics;  Laterality: Left;   VIDEO BRONCHOSCOPY Bilateral 02/07/2016   Procedure: VIDEO BRONCHOSCOPY WITHOUT FLUORO;  Surgeon: Maire Scot, MD;  Location: [facility];  Service: Cardiopulmonary;  Laterality: Bilateral;    Allergies  Allergen Reactions   Sulfa Antibiotics Anaphylaxis   Sulfamethoxazole-Trimethoprim     Other reaction(s): hives throat swell    Immunization History  Administered Date(s) Administered   Fluad Quad(high Dose 65+) 08/30/2022  Influenza, High Dose Seasonal PF 09/05/2016, 06/05/2017, 07/27/2018   Influenza,inj,Quad PF,6+ Mos 06/24/2014, 07/28/2015   PFIZER(Purple Top)SARS-COV-2 Vaccination 11/11/2019, 12/06/2019, 07/11/2020   Pfizer Covid-19 Vaccine Bivalent Booster 6yrs & up 08/07/2022   Pneumococcal Conjugate-13 06/24/2014   Pneumococcal Polysaccharide-23 07/27/2018    Family History  Problem Relation Age of Onset   Breast cancer Mother    Colon cancer Mother    Heart attack Father    Heart failure Brother      Current Outpatient Medications:    calcium-vitamin D 250-100 MG-UNIT tablet, Take 2 tablets by mouth 2 (two) times daily., Disp: , Rfl:    melatonin 5 MG TABS, Take 5 mg by mouth at bedtime as needed (for sleep)., Disp: , Rfl:    polyethylene glycol (MIRALAX  / GLYCOLAX ) 17 g packet, Take 17 g by mouth 2 (two) times daily., Disp: 14 each, Rfl: 0    Respiratory Therapy Supplies (FLUTTER) DEVI, Use as directed, Disp: 1 each, Rfl: 0   sodium chloride  HYPERTONIC 3 % nebulizer solution, Take by nebulization as needed for other or cough (bronchiectasis)., Disp: 224 mL, Rfl: 12   traZODone  (DESYREL ) 50 MG tablet, Take 50 mg by mouth at bedtime., Disp: , Rfl:    methocarbamol  (ROBAXIN ) 500 MG tablet, Take 1 tablet (500 mg total) by mouth every 6 (six) hours as needed for muscle spasms. (Patient not taking: Reported on 02/05/2024), Disp: 40 tablet, Rfl: 2   oxyCODONE  (OXY IR/ROXICODONE ) 5 MG immediate release tablet, Take 0.5-1 tablets (2.5-5 mg total) by mouth every 4 (four) hours as needed for severe pain. (Patient not taking: Reported on 02/05/2024), Disp: 42 tablet, Rfl: 0      Objective:   Vitals:   02/05/24 1422  BP: (!) 146/77  Pulse: 77  SpO2: 98%  Weight: 102 lb 3.2 oz (46.4 kg)  Height: 5\' 5"  (1.651 m)    Estimated body mass index is 17.01 kg/m as calculated from the following:   Height as of this encounter: 5\' 5"  (1.651 m).   Weight as of this encounter: 102 lb 3.2 oz (46.4 kg).  @WEIGHTCHANGE @  [employer]   02/05/24 1422  Weight: 102 lb 3.2 oz (46.4 kg)     Physical Exam   General: No distress. Looks well O2 at rest: no Cane present: no Sitting in wheel chair: no Frail: no Obese: no Neuro: Alert and Oriented x 3. GCS 15. Speech normal Psych: Pleasant Resp:  Barrel Chest - no.  Wheeze - no, Crackles - no, No overt respiratory distress CVS: Normal heart sounds. Murmurs - no Ext: Stigmata of Connective Tissue Disease - no HEENT: Normal upper airway. PEERL +. No post nasal drip        Assessment:       ICD-10-CM   1. Bronchiectasis without complication (HCC)  J47.9     2. Vaccine counseling  Z71.85          Plan:     Patient Instructions     ICD-10-CM   1. Bronchiectasis without complication (HCC)  J47.9          Bronchiectasis is clinically stable but having symptoms of mucus at night.   Mild symptom burden and tolerating it well with flutter valve Last chest x-ray approximately 1-2 years ago and last CT scan several years ago Pulmonary function test stable  Plan  -Get diligent about do flutter valve 5 times each time and at least 5-10 times daily -Start 3 mL 3% hypertonic saline nebulized daily at  night to see if it will give you symptom relief -No indication for another imaging right now - Get respiratory vaccines in the fall season - Please talk to PCP Faustina Hood, MD -  and ensure you get  shingrix (GSK) inactivated vaccine against shingles   Follow-up -12 months for routine follow-up but after breathing test 15-minute slot   FOLLOWUP Return in about 1 year (around 02/04/2025) for 15 min visit, with Dr Bertrum Brodie, Face to Face Visit.    SIGNATURE    Dr. Maire Scot, M.D., F.C.C.P,  Pulmonary and Critical Care Medicine Staff Physician, [facility] Director - Interstitial Lung Disease  Program  Pulmonary Fibrosis [facility] [place], [place], [address]  Pager: [phone], If no answer or between  15:00h - 7:00h: call [phone] Telephone: [phone]  2:42 PM 02/05/2024

## 2024-02-06 ENCOUNTER — Encounter: Payer: Self-pay | Admitting: Internal Medicine

## 2024-02-09 ENCOUNTER — Other Ambulatory Visit (HOSPITAL_BASED_OUTPATIENT_CLINIC_OR_DEPARTMENT_OTHER): Payer: Self-pay

## 2024-02-09 DIAGNOSIS — L821 Other seborrheic keratosis: Secondary | ICD-10-CM | POA: Diagnosis not present

## 2024-02-09 DIAGNOSIS — Z4802 Encounter for removal of sutures: Secondary | ICD-10-CM | POA: Diagnosis not present

## 2024-02-09 DIAGNOSIS — L719 Rosacea, unspecified: Secondary | ICD-10-CM | POA: Diagnosis not present

## 2024-02-09 DIAGNOSIS — D229 Melanocytic nevi, unspecified: Secondary | ICD-10-CM | POA: Diagnosis not present

## 2024-02-09 DIAGNOSIS — J479 Bronchiectasis, uncomplicated: Secondary | ICD-10-CM

## 2024-02-09 DIAGNOSIS — L918 Other hypertrophic disorders of the skin: Secondary | ICD-10-CM | POA: Diagnosis not present

## 2024-02-10 ENCOUNTER — Telehealth: Payer: Self-pay | Admitting: Internal Medicine

## 2024-02-10 DIAGNOSIS — J479 Bronchiectasis, uncomplicated: Secondary | ICD-10-CM

## 2024-02-10 NOTE — Telephone Encounter (Signed)
 Order needs to be signed for neb order  Please advise

## 2024-02-17 NOTE — Telephone Encounter (Signed)
 Order still not signed   Please place a new order if its not in the providers box

## 2024-02-17 NOTE — Telephone Encounter (Signed)
Has this been signed?

## 2024-03-02 NOTE — Telephone Encounter (Signed)
 Pcc's- do you still need anything regarding neb order?   It looks like the issue was that he needed to sign in Epic

## 2024-03-02 NOTE — Telephone Encounter (Signed)
 I think so. Forwarding to [employer]

## 2024-03-02 NOTE — Telephone Encounter (Signed)
 Dme order needs to be signed by provider. It's only signed by Carolyn Cisco 02/09/24 as a RX refill.

## 2024-03-02 NOTE — Telephone Encounter (Signed)
 Sent a message to [employer] to confirm neb order.

## 2024-03-03 NOTE — Telephone Encounter (Signed)
 I have placed new order  MR has already signed  Routing to [facility] to send to DME asap

## 2024-03-03 NOTE — Telephone Encounter (Signed)
 Sent to [employer] awaiting confirmation.

## 2024-03-03 NOTE — Telephone Encounter (Signed)
 [employer] has accepted the order with no issues. Nothing Futher is needed .

## 2024-03-04 DIAGNOSIS — J479 Bronchiectasis, uncomplicated: Secondary | ICD-10-CM | POA: Diagnosis not present

## 2024-04-04 DIAGNOSIS — J479 Bronchiectasis, uncomplicated: Secondary | ICD-10-CM | POA: Diagnosis not present

## 2024-04-14 ENCOUNTER — Other Ambulatory Visit: Payer: Self-pay | Admitting: Family Medicine

## 2024-04-14 DIAGNOSIS — Z1231 Encounter for screening mammogram for malignant neoplasm of breast: Secondary | ICD-10-CM

## 2024-05-04 DIAGNOSIS — J479 Bronchiectasis, uncomplicated: Secondary | ICD-10-CM | POA: Diagnosis not present

## 2024-05-20 ENCOUNTER — Ambulatory Visit
Admission: RE | Admit: 2024-05-20 | Discharge: 2024-05-20 | Disposition: A | Discharge status: Home / Self Care | Source: Ambulatory Visit | Attending: Family Medicine | Admitting: Family Medicine

## 2024-05-20 DIAGNOSIS — Z1231 Encounter for screening mammogram for malignant neoplasm of breast: Secondary | ICD-10-CM

## 2024-05-31 DIAGNOSIS — J479 Bronchiectasis, uncomplicated: Secondary | ICD-10-CM | POA: Diagnosis not present

## 2024-05-31 DIAGNOSIS — J189 Pneumonia, unspecified organism: Secondary | ICD-10-CM | POA: Diagnosis not present

## 2024-06-04 DIAGNOSIS — J479 Bronchiectasis, uncomplicated: Secondary | ICD-10-CM | POA: Diagnosis not present

## 2024-07-05 DIAGNOSIS — J479 Bronchiectasis, uncomplicated: Secondary | ICD-10-CM | POA: Diagnosis not present

## 2024-07-29 DIAGNOSIS — Z1331 Encounter for screening for depression: Secondary | ICD-10-CM | POA: Diagnosis not present

## 2024-07-29 DIAGNOSIS — E7800 Pure hypercholesterolemia, unspecified: Secondary | ICD-10-CM | POA: Diagnosis not present

## 2024-07-29 DIAGNOSIS — Z23 Encounter for immunization: Secondary | ICD-10-CM | POA: Diagnosis not present

## 2024-07-29 DIAGNOSIS — Z0000 Encounter for general adult medical examination without abnormal findings: Secondary | ICD-10-CM | POA: Diagnosis not present

## 2024-07-29 DIAGNOSIS — J479 Bronchiectasis, uncomplicated: Secondary | ICD-10-CM | POA: Diagnosis not present

## 2024-07-29 DIAGNOSIS — M810 Age-related osteoporosis without current pathological fracture: Secondary | ICD-10-CM | POA: Diagnosis not present

## 2024-07-29 DIAGNOSIS — I7300 Raynaud's syndrome without gangrene: Secondary | ICD-10-CM | POA: Diagnosis not present

## 2024-07-29 DIAGNOSIS — F5101 Primary insomnia: Secondary | ICD-10-CM | POA: Diagnosis not present

## 2024-07-29 DIAGNOSIS — K5900 Constipation, unspecified: Secondary | ICD-10-CM | POA: Diagnosis not present

## 2024-08-04 DIAGNOSIS — J479 Bronchiectasis, uncomplicated: Secondary | ICD-10-CM | POA: Diagnosis not present

## 2024-08-09 DIAGNOSIS — H903 Sensorineural hearing loss, bilateral: Secondary | ICD-10-CM | POA: Diagnosis not present

## 2024-08-30 DIAGNOSIS — D0471 Carcinoma in situ of skin of right lower limb, including hip: Secondary | ICD-10-CM | POA: Diagnosis not present
